# Patient Record
Sex: Female | Born: 1960 | Race: Black or African American | Hispanic: No | Smoking: Never smoker
Health system: Southern US, Community
[De-identification: ages and names within clinical notes are randomized; demographics above are authoritative.]

## PROBLEM LIST (undated history)

## (undated) DIAGNOSIS — N289 Disorder of kidney and ureter, unspecified: Secondary | ICD-10-CM

## (undated) DIAGNOSIS — E119 Type 2 diabetes mellitus without complications: Secondary | ICD-10-CM

## (undated) DIAGNOSIS — E079 Disorder of thyroid, unspecified: Secondary | ICD-10-CM

## (undated) DIAGNOSIS — I10 Essential (primary) hypertension: Secondary | ICD-10-CM

## (undated) DIAGNOSIS — Z8489 Family history of other specified conditions: Secondary | ICD-10-CM

## (undated) DIAGNOSIS — F209 Schizophrenia, unspecified: Secondary | ICD-10-CM

## (undated) HISTORY — PX: FOOT SURGERY: SHX648

---

## 2004-04-17 ENCOUNTER — Emergency Department: Payer: Self-pay | Admitting: Emergency Medicine

## 2004-06-14 ENCOUNTER — Emergency Department: Payer: Self-pay | Admitting: Emergency Medicine

## 2005-09-21 ENCOUNTER — Emergency Department: Payer: Self-pay | Admitting: Emergency Medicine

## 2008-08-03 ENCOUNTER — Emergency Department: Payer: Self-pay | Admitting: Emergency Medicine

## 2010-03-21 ENCOUNTER — Emergency Department: Payer: Self-pay | Admitting: Emergency Medicine

## 2010-04-08 ENCOUNTER — Ambulatory Visit: Payer: Self-pay | Admitting: Unknown Physician Specialty

## 2010-05-23 ENCOUNTER — Inpatient Hospital Stay: Payer: Self-pay | Admitting: Internal Medicine

## 2010-05-23 ENCOUNTER — Ambulatory Visit: Payer: Self-pay | Admitting: Family Medicine

## 2010-06-19 ENCOUNTER — Encounter: Payer: Self-pay | Admitting: Cardiothoracic Surgery

## 2010-07-18 ENCOUNTER — Emergency Department: Payer: Self-pay | Admitting: Internal Medicine

## 2010-07-19 ENCOUNTER — Encounter: Payer: Self-pay | Admitting: Cardiothoracic Surgery

## 2010-08-19 ENCOUNTER — Encounter: Payer: Self-pay | Admitting: Nurse Practitioner

## 2010-08-21 ENCOUNTER — Encounter: Payer: Self-pay | Admitting: Cardiothoracic Surgery

## 2010-08-21 ENCOUNTER — Ambulatory Visit: Payer: Self-pay | Admitting: Nurse Practitioner

## 2010-09-11 ENCOUNTER — Other Ambulatory Visit: Payer: Self-pay | Admitting: Surgery

## 2010-09-14 ENCOUNTER — Ambulatory Visit: Payer: Self-pay | Admitting: Surgery

## 2010-09-14 ENCOUNTER — Other Ambulatory Visit: Payer: Self-pay | Admitting: Surgery

## 2010-09-17 ENCOUNTER — Ambulatory Visit: Payer: Self-pay | Admitting: Surgery

## 2010-09-22 LAB — PATHOLOGY REPORT

## 2010-11-18 ENCOUNTER — Emergency Department: Payer: Self-pay | Admitting: Infectious Diseases

## 2011-09-15 ENCOUNTER — Encounter: Payer: Self-pay | Admitting: Family Medicine

## 2011-09-19 ENCOUNTER — Encounter: Payer: Self-pay | Admitting: Family Medicine

## 2011-10-19 ENCOUNTER — Encounter: Payer: Self-pay | Admitting: Family Medicine

## 2012-02-01 ENCOUNTER — Emergency Department: Payer: Self-pay | Admitting: Emergency Medicine

## 2012-02-01 LAB — URINALYSIS, COMPLETE
Ketone: NEGATIVE
Nitrite: NEGATIVE
Ph: 5 (ref 4.5–8.0)
Protein: NEGATIVE
RBC,UR: 4 /HPF (ref 0–5)
Specific Gravity: 1.013 (ref 1.003–1.030)
Squamous Epithelial: 7
WBC UR: 11 /HPF (ref 0–5)

## 2012-02-01 LAB — COMPREHENSIVE METABOLIC PANEL
Albumin: 2.4 g/dL — ABNORMAL LOW (ref 3.4–5.0)
Anion Gap: 7 (ref 7–16)
Bilirubin,Total: 0.5 mg/dL (ref 0.2–1.0)
Calcium, Total: 8 mg/dL — ABNORMAL LOW (ref 8.5–10.1)
Co2: 24 mmol/L (ref 21–32)
EGFR (African American): 55 — ABNORMAL LOW
EGFR (Non-African Amer.): 48 — ABNORMAL LOW
Glucose: 195 mg/dL — ABNORMAL HIGH (ref 65–99)
Potassium: 3.4 mmol/L — ABNORMAL LOW (ref 3.5–5.1)
SGPT (ALT): 25 U/L (ref 12–78)
Sodium: 137 mmol/L (ref 136–145)
Total Protein: 7.6 g/dL (ref 6.4–8.2)

## 2012-02-01 LAB — CBC
HCT: 32.3 % — ABNORMAL LOW (ref 35.0–47.0)
HGB: 10.5 g/dL — ABNORMAL LOW (ref 12.0–16.0)
MCHC: 32.4 g/dL (ref 32.0–36.0)
MCV: 71 fL — ABNORMAL LOW (ref 80–100)
RBC: 4.55 10*6/uL (ref 3.80–5.20)
RDW: 16.1 % — ABNORMAL HIGH (ref 11.5–14.5)
WBC: 10.8 10*3/uL (ref 3.6–11.0)

## 2012-02-01 LAB — MAGNESIUM: Magnesium: 1.8 mg/dL

## 2012-02-01 LAB — CK TOTAL AND CKMB (NOT AT ARMC): CK, Total: 1335 U/L — ABNORMAL HIGH (ref 21–215)

## 2012-02-01 LAB — TROPONIN I: Troponin-I: <0.02 ng/mL

## 2012-03-27 ENCOUNTER — Ambulatory Visit: Payer: Self-pay | Admitting: Family Medicine

## 2012-03-31 ENCOUNTER — Encounter: Payer: Self-pay | Admitting: Cardiothoracic Surgery

## 2012-04-14 ENCOUNTER — Ambulatory Visit: Payer: Self-pay | Admitting: Nurse Practitioner

## 2012-04-18 ENCOUNTER — Encounter: Payer: Self-pay | Admitting: Cardiothoracic Surgery

## 2012-05-01 ENCOUNTER — Ambulatory Visit: Payer: Self-pay | Admitting: Vascular Surgery

## 2012-05-01 LAB — BASIC METABOLIC PANEL
Anion Gap: 4 — ABNORMAL LOW (ref 7–16)
Calcium, Total: 7.4 mg/dL — ABNORMAL LOW (ref 8.5–10.1)
Co2: 27 mmol/L (ref 21–32)
Creatinine: 1.23 mg/dL (ref 0.60–1.30)
Glucose: 102 mg/dL — ABNORMAL HIGH (ref 65–99)
Osmolality: 279 (ref 275–301)
Potassium: 4.1 mmol/L (ref 3.5–5.1)

## 2012-05-02 ENCOUNTER — Ambulatory Visit: Payer: Self-pay | Admitting: Vascular Surgery

## 2012-05-02 LAB — CBC
HCT: 27.6 % — ABNORMAL LOW (ref 35.0–47.0)
MCH: 22.3 pg — ABNORMAL LOW (ref 26.0–34.0)
MCHC: 31.1 g/dL — ABNORMAL LOW (ref 32.0–36.0)
MCV: 72 fL — ABNORMAL LOW (ref 80–100)
Platelet: 374 10*3/uL (ref 150–440)
RDW: 17.7 % — ABNORMAL HIGH (ref 11.5–14.5)
WBC: 6.1 10*3/uL (ref 3.6–11.0)

## 2012-05-02 LAB — BASIC METABOLIC PANEL
BUN: 10 mg/dL (ref 7–18)
Calcium, Total: 7.5 mg/dL — ABNORMAL LOW (ref 8.5–10.1)
Chloride: 109 mmol/L — ABNORMAL HIGH (ref 98–107)
Co2: 28 mmol/L (ref 21–32)
Creatinine: 1.06 mg/dL (ref 0.60–1.30)
EGFR (African American): >60
EGFR (Non-African Amer.): >60
Glucose: 97 mg/dL (ref 65–99)
Osmolality: 278 (ref 275–301)
Sodium: 140 mmol/L (ref 136–145)

## 2012-05-04 ENCOUNTER — Ambulatory Visit: Payer: Self-pay | Admitting: Vascular Surgery

## 2012-05-08 LAB — PATHOLOGY REPORT

## 2012-05-18 ENCOUNTER — Encounter: Payer: Self-pay | Admitting: Cardiothoracic Surgery

## 2012-08-16 ENCOUNTER — Ambulatory Visit: Payer: Self-pay | Admitting: Ophthalmology

## 2012-08-25 ENCOUNTER — Emergency Department: Payer: Self-pay | Admitting: Emergency Medicine

## 2012-08-25 LAB — CBC
HCT: 32.3 % — ABNORMAL LOW (ref 35.0–47.0)
MCH: 21.9 pg — ABNORMAL LOW (ref 26.0–34.0)
MCHC: 32.4 g/dL (ref 32.0–36.0)
MCV: 68 fL — ABNORMAL LOW (ref 80–100)
RBC: 4.79 10*6/uL (ref 3.80–5.20)

## 2012-08-25 LAB — URINALYSIS, COMPLETE
Bilirubin,UR: NEGATIVE
Glucose,UR: NEGATIVE mg/dL (ref 0–75)
Hyaline Cast: 2
Ketone: NEGATIVE
Nitrite: NEGATIVE
Ph: 6 (ref 4.5–8.0)
RBC,UR: 6 /HPF (ref 0–5)
Specific Gravity: 1.014 (ref 1.003–1.030)
Squamous Epithelial: 15
WBC UR: 26 /HPF (ref 0–5)

## 2012-08-25 LAB — COMPREHENSIVE METABOLIC PANEL
Albumin: 2.9 g/dL — ABNORMAL LOW (ref 3.4–5.0)
BUN: 18 mg/dL (ref 7–18)
Calcium, Total: 8.4 mg/dL — ABNORMAL LOW (ref 8.5–10.1)
Chloride: 106 mmol/L (ref 98–107)
Co2: 26 mmol/L (ref 21–32)
Creatinine: 1.42 mg/dL — ABNORMAL HIGH (ref 0.60–1.30)
EGFR (African American): 50 — ABNORMAL LOW
EGFR (Non-African Amer.): 43 — ABNORMAL LOW
Glucose: 126 mg/dL — ABNORMAL HIGH (ref 65–99)
Osmolality: 279 (ref 275–301)
Potassium: 4 mmol/L (ref 3.5–5.1)
Total Protein: 8.7 g/dL — ABNORMAL HIGH (ref 6.4–8.2)

## 2012-11-01 ENCOUNTER — Encounter: Payer: Self-pay | Admitting: Surgery

## 2012-11-18 ENCOUNTER — Encounter: Payer: Self-pay | Admitting: Surgery

## 2012-12-18 ENCOUNTER — Encounter: Payer: Self-pay | Admitting: Surgery

## 2013-01-01 LAB — WOUND AEROBIC CULTURE

## 2013-01-17 ENCOUNTER — Inpatient Hospital Stay: Payer: Self-pay | Admitting: Internal Medicine

## 2013-01-17 LAB — BASIC METABOLIC PANEL
Anion Gap: 3 — ABNORMAL LOW (ref 7–16)
BUN: 45 mg/dL — ABNORMAL HIGH (ref 7–18)
Calcium, Total: 9.1 mg/dL (ref 8.5–10.1)
Co2: 27 mmol/L (ref 21–32)
EGFR (African American): 37 — ABNORMAL LOW
Glucose: 111 mg/dL — ABNORMAL HIGH (ref 65–99)
Osmolality: 282 (ref 275–301)
Potassium: 4.6 mmol/L (ref 3.5–5.1)

## 2013-01-17 LAB — CBC WITH DIFFERENTIAL/PLATELET
Basophil #: 0.1 10*3/uL (ref 0.0–0.1)
Basophil %: 1 %
Eosinophil #: 0.3 10*3/uL (ref 0.0–0.7)
Eosinophil %: 4 %
HCT: 30.3 % — ABNORMAL LOW (ref 35.0–47.0)
HGB: 9.7 g/dL — ABNORMAL LOW (ref 12.0–16.0)
Lymphocyte #: 1 10*3/uL (ref 1.0–3.6)
Lymphocyte %: 15.3 %
MCV: 69 fL — ABNORMAL LOW (ref 80–100)
Monocyte #: 0.6 x10 3/mm (ref 0.2–0.9)
Monocyte %: 9.5 %
Neutrophil #: 4.4 10*3/uL (ref 1.4–6.5)
Neutrophil %: 70.2 %
RBC: 4.38 10*6/uL (ref 3.80–5.20)
RDW: 16.1 % — ABNORMAL HIGH (ref 11.5–14.5)
WBC: 6.3 10*3/uL (ref 3.6–11.0)

## 2013-01-18 LAB — CBC WITH DIFFERENTIAL/PLATELET
Basophil #: 0 10*3/uL (ref 0.0–0.1)
Basophil %: 0.5 %
Eosinophil #: 0.2 10*3/uL (ref 0.0–0.7)
Eosinophil %: 4.5 %
HCT: 26.2 % — ABNORMAL LOW (ref 35.0–47.0)
HGB: 8.4 g/dL — ABNORMAL LOW (ref 12.0–16.0)
Lymphocyte #: 0.7 10*3/uL — ABNORMAL LOW (ref 1.0–3.6)
Lymphocyte %: 16.1 %
MCH: 22 pg — ABNORMAL LOW (ref 26.0–34.0)
MCHC: 31.9 g/dL — ABNORMAL LOW (ref 32.0–36.0)
MCV: 69 fL — ABNORMAL LOW (ref 80–100)
Monocyte #: 0.5 x10 3/mm (ref 0.2–0.9)
Monocyte %: 10.7 %
Neutrophil #: 3 10*3/uL (ref 1.4–6.5)
Neutrophil %: 68.2 %
Platelet: 324 10*3/uL (ref 150–440)
RBC: 3.8 10*6/uL (ref 3.80–5.20)
RDW: 16.1 % — ABNORMAL HIGH (ref 11.5–14.5)
WBC: 4.3 10*3/uL (ref 3.6–11.0)

## 2013-01-18 LAB — BASIC METABOLIC PANEL
Anion Gap: 5 — ABNORMAL LOW (ref 7–16)
BUN: 38 mg/dL — ABNORMAL HIGH (ref 7–18)
Calcium, Total: 8.2 mg/dL — ABNORMAL LOW (ref 8.5–10.1)
Chloride: 108 mmol/L — ABNORMAL HIGH (ref 98–107)
Co2: 25 mmol/L (ref 21–32)
Creatinine: 1.61 mg/dL — ABNORMAL HIGH (ref 0.60–1.30)
EGFR (African American): 43 — ABNORMAL LOW
EGFR (Non-African Amer.): 37 — ABNORMAL LOW
Glucose: 114 mg/dL — ABNORMAL HIGH (ref 65–99)
Osmolality: 286 (ref 275–301)
Potassium: 5 mmol/L (ref 3.5–5.1)
Sodium: 138 mmol/L (ref 136–145)

## 2013-01-19 LAB — BASIC METABOLIC PANEL
Anion Gap: 3 — ABNORMAL LOW (ref 7–16)
BUN: 29 mg/dL — ABNORMAL HIGH (ref 7–18)
CALCIUM: 8.2 mg/dL — AB (ref 8.5–10.1)
Chloride: 105 mmol/L (ref 98–107)
Co2: 27 mmol/L (ref 21–32)
Creatinine: 1.64 mg/dL — ABNORMAL HIGH (ref 0.60–1.30)
EGFR (African American): 42 — ABNORMAL LOW
EGFR (Non-African Amer.): 36 — ABNORMAL LOW
Glucose: 106 mg/dL — ABNORMAL HIGH (ref 65–99)
Osmolality: 276 (ref 275–301)
Potassium: 5.2 mmol/L — ABNORMAL HIGH (ref 3.5–5.1)
SODIUM: 135 mmol/L — AB (ref 136–145)

## 2013-01-20 ENCOUNTER — Encounter: Payer: Self-pay | Admitting: Surgery

## 2013-01-22 LAB — CULTURE, BLOOD (SINGLE)

## 2013-01-25 ENCOUNTER — Encounter: Payer: Self-pay | Admitting: Surgery

## 2013-02-18 ENCOUNTER — Encounter: Payer: Self-pay | Admitting: Surgery

## 2013-03-18 ENCOUNTER — Encounter: Payer: Self-pay | Admitting: Surgery

## 2013-04-18 ENCOUNTER — Encounter: Payer: Self-pay | Admitting: Surgery

## 2013-04-20 ENCOUNTER — Encounter: Payer: Self-pay | Admitting: Surgery

## 2013-09-30 ENCOUNTER — Inpatient Hospital Stay: Payer: Self-pay | Admitting: Internal Medicine

## 2013-09-30 LAB — COMPREHENSIVE METABOLIC PANEL
AST: 61 U/L — AB (ref 15–37)
Albumin: 2.6 g/dL — ABNORMAL LOW (ref 3.4–5.0)
Alkaline Phosphatase: 136 U/L — ABNORMAL HIGH
Anion Gap: 9 (ref 7–16)
BILIRUBIN TOTAL: 0.5 mg/dL (ref 0.2–1.0)
BUN: 87 mg/dL — ABNORMAL HIGH (ref 7–18)
CO2: 21 mmol/L (ref 21–32)
Calcium, Total: 8.7 mg/dL (ref 8.5–10.1)
Chloride: 102 mmol/L (ref 98–107)
Creatinine: 4.07 mg/dL — ABNORMAL HIGH (ref 0.60–1.30)
EGFR (African American): 14 — ABNORMAL LOW
EGFR (Non-African Amer.): 12 — ABNORMAL LOW
Glucose: 187 mg/dL — ABNORMAL HIGH (ref 65–99)
Osmolality: 296 (ref 275–301)
POTASSIUM: 4.4 mmol/L (ref 3.5–5.1)
SGPT (ALT): 51 U/L
SODIUM: 132 mmol/L — AB (ref 136–145)
Total Protein: 8.6 g/dL — ABNORMAL HIGH (ref 6.4–8.2)

## 2013-09-30 LAB — URINALYSIS, COMPLETE
Bilirubin,UR: NEGATIVE
Glucose,UR: NEGATIVE mg/dL (ref 0–75)
Ketone: NEGATIVE
Nitrite: NEGATIVE
PH: 5 (ref 4.5–8.0)
RBC,UR: 7 /HPF (ref 0–5)
Specific Gravity: 1.013 (ref 1.003–1.030)
Squamous Epithelial: 2
WBC UR: 60 /HPF (ref 0–5)

## 2013-09-30 LAB — CBC WITH DIFFERENTIAL/PLATELET
BASOS PCT: 0.4 %
Basophil #: 0.1 10*3/uL (ref 0.0–0.1)
EOS PCT: 0 %
Eosinophil #: 0 10*3/uL (ref 0.0–0.7)
HCT: 36.9 % (ref 35.0–47.0)
HGB: 11.6 g/dL — ABNORMAL LOW (ref 12.0–16.0)
LYMPHS ABS: 0.9 10*3/uL — AB (ref 1.0–3.6)
Lymphocyte %: 4.8 %
MCH: 22.4 pg — AB (ref 26.0–34.0)
MCHC: 31.5 g/dL — ABNORMAL LOW (ref 32.0–36.0)
MCV: 71 fL — AB (ref 80–100)
MONO ABS: 0.7 x10 3/mm (ref 0.2–0.9)
Monocyte %: 3.8 %
Neutrophil #: 17.1 10*3/uL — ABNORMAL HIGH (ref 1.4–6.5)
Neutrophil %: 91 %
Platelet: 290 10*3/uL (ref 150–440)
RBC: 5.19 10*6/uL (ref 3.80–5.20)
RDW: 16.3 % — ABNORMAL HIGH (ref 11.5–14.5)
WBC: 18.8 10*3/uL — ABNORMAL HIGH (ref 3.6–11.0)

## 2013-09-30 LAB — CK TOTAL AND CKMB (NOT AT ARMC)
CK, Total: 1313 U/L — ABNORMAL HIGH
CK-MB: 27.6 ng/mL — AB (ref 0.5–3.6)

## 2013-09-30 LAB — TROPONIN I: Troponin-I: <0.02 ng/mL

## 2013-10-01 LAB — BASIC METABOLIC PANEL
Anion Gap: 13 (ref 7–16)
BUN: 86 mg/dL — AB (ref 7–18)
CALCIUM: 8.2 mg/dL — AB (ref 8.5–10.1)
CO2: 20 mmol/L — AB (ref 21–32)
CREATININE: 3.21 mg/dL — AB (ref 0.60–1.30)
Chloride: 107 mmol/L (ref 98–107)
EGFR (African American): 18 — ABNORMAL LOW
GFR CALC NON AF AMER: 16 — AB
Glucose: 170 mg/dL — ABNORMAL HIGH (ref 65–99)
OSMOLALITY: 310 (ref 275–301)
Potassium: 4.4 mmol/L (ref 3.5–5.1)
Sodium: 140 mmol/L (ref 136–145)

## 2013-10-02 LAB — CBC WITH DIFFERENTIAL/PLATELET
EOS PCT: 1 %
HCT: 26 % — AB (ref 35.0–47.0)
HGB: 8.5 g/dL — ABNORMAL LOW (ref 12.0–16.0)
LYMPHS PCT: 17 %
MCH: 22.9 pg — ABNORMAL LOW (ref 26.0–34.0)
MCHC: 32.6 g/dL (ref 32.0–36.0)
MCV: 70 fL — AB (ref 80–100)
MONOS PCT: 1 %
Myelocyte: 1 %
Platelet: 256 10*3/uL (ref 150–440)
RBC: 3.7 10*6/uL — AB (ref 3.80–5.20)
RDW: 16.2 % — AB (ref 11.5–14.5)
Segmented Neutrophils: 80 %
WBC: 8.5 10*3/uL (ref 3.6–11.0)

## 2013-10-02 LAB — BASIC METABOLIC PANEL
Anion Gap: 9 (ref 7–16)
BUN: 64 mg/dL — ABNORMAL HIGH (ref 7–18)
CHLORIDE: 116 mmol/L — AB (ref 98–107)
CREATININE: 1.66 mg/dL — AB (ref 0.60–1.30)
Calcium, Total: 6.5 mg/dL — CL (ref 8.5–10.1)
Co2: 20 mmol/L — ABNORMAL LOW (ref 21–32)
EGFR (Non-African Amer.): 35 — ABNORMAL LOW
GFR CALC AF AMER: 41 — AB
Glucose: 162 mg/dL — ABNORMAL HIGH (ref 65–99)
Osmolality: 311 (ref 275–301)
POTASSIUM: 3.2 mmol/L — AB (ref 3.5–5.1)
Sodium: 145 mmol/L (ref 136–145)

## 2013-10-02 LAB — IRON AND TIBC
IRON SATURATION: 20 %
IRON: 30 ug/dL — AB (ref 50–170)
Iron Bind.Cap.(Total): 153 ug/dL — ABNORMAL LOW (ref 250–450)
UNBOUND IRON-BIND. CAP.: 123 ug/dL

## 2013-10-02 LAB — FERRITIN: Ferritin (ARMC): 685 ng/mL — ABNORMAL HIGH (ref 8–388)

## 2013-10-02 LAB — CK: CK, Total: 375 U/L — ABNORMAL HIGH

## 2013-10-02 LAB — URINE CULTURE

## 2013-10-03 LAB — BASIC METABOLIC PANEL
Anion Gap: 7 (ref 7–16)
BUN: 50 mg/dL — AB (ref 7–18)
CHLORIDE: 112 mmol/L — AB (ref 98–107)
CO2: 22 mmol/L (ref 21–32)
Calcium, Total: 7.5 mg/dL — ABNORMAL LOW (ref 8.5–10.1)
Creatinine: 1.37 mg/dL — ABNORMAL HIGH (ref 0.60–1.30)
EGFR (African American): 52 — ABNORMAL LOW
EGFR (Non-African Amer.): 45 — ABNORMAL LOW
GLUCOSE: 154 mg/dL — AB (ref 65–99)
OSMOLALITY: 299 (ref 275–301)
Potassium: 4.4 mmol/L (ref 3.5–5.1)
Sodium: 141 mmol/L (ref 136–145)

## 2013-10-05 LAB — CULTURE, BLOOD (SINGLE)

## 2013-10-27 ENCOUNTER — Emergency Department: Payer: Self-pay | Admitting: Emergency Medicine

## 2013-10-27 LAB — URINALYSIS, COMPLETE
Bacteria: NONE SEEN
Bilirubin,UR: NEGATIVE
Glucose,UR: NEGATIVE mg/dL (ref 0–75)
Ketone: NEGATIVE
Leukocyte Esterase: NEGATIVE
Nitrite: NEGATIVE
PH: 6 (ref 4.5–8.0)
Protein: 100
RBC,UR: 139 /HPF (ref 0–5)
Specific Gravity: 1.013 (ref 1.003–1.030)
Squamous Epithelial: 1

## 2013-10-27 LAB — COMPREHENSIVE METABOLIC PANEL
ALBUMIN: 2.8 g/dL — AB (ref 3.4–5.0)
AST: 26 U/L (ref 15–37)
Alkaline Phosphatase: 182 U/L — ABNORMAL HIGH
Anion Gap: 7 (ref 7–16)
BILIRUBIN TOTAL: 0.3 mg/dL (ref 0.2–1.0)
BUN: 21 mg/dL — AB (ref 7–18)
CHLORIDE: 108 mmol/L — AB (ref 98–107)
Calcium, Total: 7.8 mg/dL — ABNORMAL LOW (ref 8.5–10.1)
Co2: 28 mmol/L (ref 21–32)
Creatinine: 1.24 mg/dL (ref 0.60–1.30)
EGFR (African American): 59 — ABNORMAL LOW
EGFR (Non-African Amer.): 48 — ABNORMAL LOW
Glucose: 136 mg/dL — ABNORMAL HIGH (ref 65–99)
OSMOLALITY: 290 (ref 275–301)
Potassium: 5 mmol/L (ref 3.5–5.1)
SGPT (ALT): 14 U/L
SODIUM: 143 mmol/L (ref 136–145)
Total Protein: 8.1 g/dL (ref 6.4–8.2)

## 2013-10-27 LAB — CBC
HCT: 34.1 % — AB (ref 35.0–47.0)
HGB: 10.2 g/dL — AB (ref 12.0–16.0)
MCH: 22.1 pg — ABNORMAL LOW (ref 26.0–34.0)
MCHC: 29.7 g/dL — ABNORMAL LOW (ref 32.0–36.0)
MCV: 75 fL — ABNORMAL LOW (ref 80–100)
Platelet: 231 10*3/uL (ref 150–440)
RBC: 4.59 10*6/uL (ref 3.80–5.20)
RDW: 17.7 % — AB (ref 11.5–14.5)
WBC: 6.1 10*3/uL (ref 3.6–11.0)

## 2013-12-06 ENCOUNTER — Observation Stay: Payer: Self-pay | Admitting: Internal Medicine

## 2013-12-06 LAB — COMPREHENSIVE METABOLIC PANEL
ALBUMIN: 3.3 g/dL — AB (ref 3.4–5.0)
AST: 17 U/L (ref 15–37)
Alkaline Phosphatase: 143 U/L — ABNORMAL HIGH
Anion Gap: 7 (ref 7–16)
BILIRUBIN TOTAL: 0.5 mg/dL (ref 0.2–1.0)
BUN: 31 mg/dL — ABNORMAL HIGH (ref 7–18)
CHLORIDE: 105 mmol/L (ref 98–107)
CREATININE: 1.68 mg/dL — AB (ref 0.60–1.30)
Calcium, Total: 8.8 mg/dL (ref 8.5–10.1)
Co2: 27 mmol/L (ref 21–32)
EGFR (African American): 41 — ABNORMAL LOW
EGFR (Non-African Amer.): 34 — ABNORMAL LOW
Glucose: 164 mg/dL — ABNORMAL HIGH (ref 65–99)
OSMOLALITY: 288 (ref 275–301)
Potassium: 3.9 mmol/L (ref 3.5–5.1)
SGPT (ALT): 16 U/L
Sodium: 139 mmol/L (ref 136–145)
TOTAL PROTEIN: 9.4 g/dL — AB (ref 6.4–8.2)

## 2013-12-06 LAB — URINALYSIS, COMPLETE
BILIRUBIN, UR: NEGATIVE
Blood: NEGATIVE
Glucose,UR: NEGATIVE mg/dL (ref 0–75)
KETONE: NEGATIVE
LEUKOCYTE ESTERASE: NEGATIVE
Nitrite: NEGATIVE
PROTEIN: NEGATIVE
Ph: 5 (ref 4.5–8.0)
RBC,UR: NONE SEEN /HPF (ref 0–5)
SPECIFIC GRAVITY: 1.014 (ref 1.003–1.030)

## 2013-12-06 LAB — DRUG SCREEN, URINE

## 2013-12-06 LAB — PROTIME-INR
INR: 1.2
Prothrombin Time: 14.9 secs — ABNORMAL HIGH (ref 11.5–14.7)

## 2013-12-06 LAB — CBC
HCT: 39.9 % (ref 35.0–47.0)
HGB: 12.4 g/dL (ref 12.0–16.0)
MCH: 22.8 pg — ABNORMAL LOW (ref 26.0–34.0)
MCHC: 31.2 g/dL — AB (ref 32.0–36.0)
MCV: 73 fL — AB (ref 80–100)
PLATELETS: 223 10*3/uL (ref 150–440)
RBC: 5.45 10*6/uL — AB (ref 3.80–5.20)
RDW: 14.8 % — ABNORMAL HIGH (ref 11.5–14.5)
WBC: 9.4 10*3/uL (ref 3.6–11.0)

## 2013-12-06 LAB — TROPONIN I: Troponin-I: <0.02 ng/mL

## 2013-12-06 LAB — ETHANOL: Ethanol: <3 mg/dL

## 2013-12-06 LAB — LIPASE, BLOOD: Lipase: 66 U/L — ABNORMAL LOW (ref 73–393)

## 2013-12-07 LAB — BASIC METABOLIC PANEL
Anion Gap: 8 (ref 7–16)
BUN: 33 mg/dL — AB (ref 7–18)
CALCIUM: 7.8 mg/dL — AB (ref 8.5–10.1)
Chloride: 108 mmol/L — ABNORMAL HIGH (ref 98–107)
Co2: 28 mmol/L (ref 21–32)
Creatinine: 1.47 mg/dL — ABNORMAL HIGH (ref 0.60–1.30)
EGFR (African American): 48 — ABNORMAL LOW
EGFR (Non-African Amer.): 40 — ABNORMAL LOW
GLUCOSE: 97 mg/dL (ref 65–99)
Osmolality: 294 (ref 275–301)
POTASSIUM: 3.6 mmol/L (ref 3.5–5.1)
SODIUM: 144 mmol/L (ref 136–145)

## 2013-12-07 LAB — CBC WITH DIFFERENTIAL/PLATELET
Basophil #: 0 10*3/uL (ref 0.0–0.1)
Basophil %: 0.2 %
EOS PCT: 1.3 %
Eosinophil #: 0.1 10*3/uL (ref 0.0–0.7)
HCT: 32.2 % — ABNORMAL LOW (ref 35.0–47.0)
HGB: 10 g/dL — ABNORMAL LOW (ref 12.0–16.0)
LYMPHS PCT: 13 %
Lymphocyte #: 1.1 10*3/uL (ref 1.0–3.6)
MCH: 22.9 pg — AB (ref 26.0–34.0)
MCHC: 31.1 g/dL — ABNORMAL LOW (ref 32.0–36.0)
MCV: 74 fL — AB (ref 80–100)
MONO ABS: 0.8 x10 3/mm (ref 0.2–0.9)
MONOS PCT: 9 %
NEUTROS ABS: 6.6 10*3/uL — AB (ref 1.4–6.5)
NEUTROS PCT: 76.5 %
Platelet: 189 10*3/uL (ref 150–440)
RBC: 4.38 10*6/uL (ref 3.80–5.20)
RDW: 14.6 % — ABNORMAL HIGH (ref 11.5–14.5)
WBC: 8.7 10*3/uL (ref 3.6–11.0)

## 2013-12-07 LAB — TSH: Thyroid Stimulating Horm: 0.02 u[IU]/mL — ABNORMAL LOW

## 2013-12-18 ENCOUNTER — Inpatient Hospital Stay (HOSPITAL_COMMUNITY)
Admission: EM | Admit: 2013-12-18 | Discharge: 2013-12-22 | DRG: 093 | Disposition: A | Discharge status: Home / Self Care | Payer: Medicare Other | Attending: Internal Medicine | Admitting: Internal Medicine

## 2013-12-18 ENCOUNTER — Emergency Department (HOSPITAL_COMMUNITY): Payer: Medicare Other

## 2013-12-18 ENCOUNTER — Encounter (HOSPITAL_COMMUNITY): Payer: Self-pay | Admitting: Emergency Medicine

## 2013-12-18 DIAGNOSIS — G9340 Encephalopathy, unspecified: Secondary | ICD-10-CM | POA: Diagnosis present

## 2013-12-18 DIAGNOSIS — F329 Major depressive disorder, single episode, unspecified: Secondary | ICD-10-CM | POA: Diagnosis present

## 2013-12-18 DIAGNOSIS — R4182 Altered mental status, unspecified: Secondary | ICD-10-CM

## 2013-12-18 DIAGNOSIS — G92 Toxic encephalopathy: Principal | ICD-10-CM | POA: Diagnosis present

## 2013-12-18 DIAGNOSIS — E039 Hypothyroidism, unspecified: Secondary | ICD-10-CM | POA: Diagnosis present

## 2013-12-18 DIAGNOSIS — I10 Essential (primary) hypertension: Secondary | ICD-10-CM | POA: Diagnosis present

## 2013-12-18 DIAGNOSIS — J019 Acute sinusitis, unspecified: Secondary | ICD-10-CM | POA: Diagnosis present

## 2013-12-18 DIAGNOSIS — I272 Other secondary pulmonary hypertension: Secondary | ICD-10-CM | POA: Diagnosis present

## 2013-12-18 DIAGNOSIS — T43595A Adverse effect of other antipsychotics and neuroleptics, initial encounter: Secondary | ICD-10-CM | POA: Diagnosis present

## 2013-12-18 DIAGNOSIS — E119 Type 2 diabetes mellitus without complications: Secondary | ICD-10-CM

## 2013-12-18 DIAGNOSIS — H9190 Unspecified hearing loss, unspecified ear: Secondary | ICD-10-CM | POA: Diagnosis present

## 2013-12-18 DIAGNOSIS — F32A Depression, unspecified: Secondary | ICD-10-CM | POA: Diagnosis present

## 2013-12-18 DIAGNOSIS — R509 Fever, unspecified: Secondary | ICD-10-CM | POA: Diagnosis present

## 2013-12-18 DIAGNOSIS — D72829 Elevated white blood cell count, unspecified: Secondary | ICD-10-CM | POA: Diagnosis present

## 2013-12-18 HISTORY — DX: Disorder of kidney and ureter, unspecified: N28.9

## 2013-12-18 HISTORY — DX: Essential (primary) hypertension: I10

## 2013-12-18 HISTORY — DX: Type 2 diabetes mellitus without complications: E11.9

## 2013-12-18 LAB — COMPREHENSIVE METABOLIC PANEL
ALT: 10 U/L (ref 0–35)
AST: 16 U/L (ref 0–37)
Albumin: 3 g/dL — ABNORMAL LOW (ref 3.5–5.2)
Alkaline Phosphatase: 125 U/L — ABNORMAL HIGH (ref 39–117)
Anion gap: 14 (ref 5–15)
BILIRUBIN TOTAL: 0.3 mg/dL (ref 0.3–1.2)
BUN: 20 mg/dL (ref 6–23)
CHLORIDE: 101 meq/L (ref 96–112)
CO2: 28 mEq/L (ref 19–32)
CREATININE: 1.25 mg/dL — AB (ref 0.50–1.10)
Calcium: 9 mg/dL (ref 8.4–10.5)
GFR calc Af Amer: 57 mL/min — ABNORMAL LOW (ref >90)
GFR, EST NON AFRICAN AMERICAN: 49 mL/min — AB (ref >90)
GLUCOSE: 172 mg/dL — AB (ref 70–99)
Potassium: 4.4 mEq/L (ref 3.7–5.3)
Sodium: 143 mEq/L (ref 137–147)
Total Protein: 8.6 g/dL — ABNORMAL HIGH (ref 6.0–8.3)

## 2013-12-18 LAB — URINALYSIS, ROUTINE W REFLEX MICROSCOPIC
Bilirubin Urine: NEGATIVE
GLUCOSE, UA: NEGATIVE mg/dL
Hgb urine dipstick: NEGATIVE
Ketones, ur: NEGATIVE mg/dL
LEUKOCYTES UA: NEGATIVE
Nitrite: NEGATIVE
PROTEIN: NEGATIVE mg/dL
Specific Gravity, Urine: 1.011 (ref 1.005–1.030)
Urobilinogen, UA: 1 mg/dL (ref 0.0–1.0)
pH: 5 (ref 5.0–8.0)

## 2013-12-18 LAB — CBG MONITORING, ED: Glucose-Capillary: 157 mg/dL — ABNORMAL HIGH (ref 70–99)

## 2013-12-18 LAB — CBC
HEMATOCRIT: 35.1 % — AB (ref 36.0–46.0)
Hemoglobin: 11.1 g/dL — ABNORMAL LOW (ref 12.0–15.0)
MCH: 22.5 pg — ABNORMAL LOW (ref 26.0–34.0)
MCHC: 31.6 g/dL (ref 30.0–36.0)
MCV: 71.2 fL — AB (ref 78.0–100.0)
PLATELETS: 413 10*3/uL — AB (ref 150–400)
RBC: 4.93 MIL/uL (ref 3.87–5.11)
RDW: 14.4 % (ref 11.5–15.5)
WBC: 18 10*3/uL — ABNORMAL HIGH (ref 4.0–10.5)

## 2013-12-18 LAB — I-STAT CG4 LACTIC ACID, ED: Lactic Acid, Venous: 1.29 mmol/L (ref 0.5–2.2)

## 2013-12-18 MED ORDER — SODIUM CHLORIDE 0.9 % IV BOLUS (SEPSIS)
500.0000 mL | Freq: Once | INTRAVENOUS | Status: AC
  Administered 2013-12-18: 500 mL via INTRAVENOUS

## 2013-12-18 NOTE — ED Notes (Signed)
Pt continues to be monitored by blood pressure, pulse ox, and 12 lead. pts EKG given to and signed by Dr. Jeraldine LootsLockwood

## 2013-12-18 NOTE — ED Notes (Signed)
Per ems-- daughter reports she came home at 130 to find pt altered. Pt wound not answer question appropriately for ems. Stroke scale negative. Pt tx for UTI last week. Vs stable.

## 2013-12-18 NOTE — ED Notes (Signed)
pts CBG 157 reported to [place], NP

## 2013-12-18 NOTE — ED Provider Notes (Signed)
CSN: [id number]     Arrival date & time 12/18/13  2004 History   First MD Initiated Contact with Patient 12/18/13 2032     Chief Complaint  Patient presents with  . Altered Mental Status     (Consider location/radiation/quality/duration/timing/severity/associated sxs/prior Treatment) HPI Comments: Per EMS report was found by daughter to be less responsive than normal on her arrival home this evening. Patient reports nausea, no vomiting, loose stools  Patient is a 53 y.o. female presenting with altered mental status. The history is provided by the patient and the EMS personnel.  Altered Mental Status Presenting symptoms: behavior changes, lethargy and partial responsiveness   Severity:  Moderate Most recent episode:  Today Timing:  Unable to specify Progression:  Unable to specify Chronicity:  New Context: recent infection   Associated symptoms: nausea and weakness   Associated symptoms: no abdominal pain, no fever and no vomiting     Past Medical History  Diagnosis Date  . Renal disorder   . Hypertension   . Diabetes mellitus without complication    Past Surgical History  Procedure Laterality Date  . Foot surgery     No family history on file. History  Substance Use Topics  . Smoking status: Never Smoker   . Smokeless tobacco: Not on file  . Alcohol Use: Yes     Comment: occasionally   OB History    No data available     Review of Systems  Constitutional: Positive for activity change. Negative for fever.  Respiratory: Negative for shortness of breath.   Cardiovascular: Negative for chest pain.  Gastrointestinal: Positive for nausea. Negative for vomiting and abdominal pain.  Neurological: Positive for weakness.  All other systems reviewed and are negative.     Allergies  Review of patient's allergies indicates no known allergies.  Home Medications   Prior to Admission medications   Medication Sig Start Date End Date Taking? Authorizing Provider   atenolol (TENORMIN) 50 MG tablet Take 50 mg by mouth daily.   Yes Historical Provider, MD  citalopram (CELEXA) 20 MG tablet Take 20 mg by mouth daily.   Yes Historical Provider, MD  furosemide (LASIX) 40 MG tablet Take 40 mg by mouth daily.   Yes Historical Provider, MD  glimepiride (AMARYL) 4 MG tablet Take 4 mg by mouth daily with breakfast.   Yes Historical Provider, MD  levothyroxine (SYNTHROID, LEVOTHROID) 125 MCG tablet Take 125 mcg by mouth daily before breakfast.   Yes Historical Provider, MD  mirtazapine (REMERON) 15 MG tablet Take 15 mg by mouth at bedtime.   Yes Historical Provider, MD  Multiple Vitamins-Minerals (MULTIVITAMINS THER. W/MINERALS) TABS tablet Take 1 tablet by mouth daily.   Yes Historical Provider, MD  potassium chloride SA (K-DUR,KLOR-CON) 20 MEQ tablet Take 20 mEq by mouth daily.   Yes Historical Provider, MD  QUEtiapine (SEROQUEL) 50 MG tablet Take 50 mg by mouth at bedtime.   Yes Historical Provider, MD   BP 115/51 mmHg  Pulse 62  Temp(Src) 100.3 F (37.9 C) (Rectal)  Resp 22  Ht 5\' 9"  (1.753 m)  Wt 180 lb (81.647 kg)  BMI 26.57 kg/m2  SpO2 99% Physical Exam  Constitutional: She appears well-developed and well-nourished. She appears lethargic. No distress.  HENT:  Head: Normocephalic.  Eyes: Pupils are equal, round, and reactive to light.  Neck: Normal range of motion.  Cardiovascular: Normal rate and regular rhythm.   Pulmonary/Chest: Effort normal and breath sounds normal.  Abdominal: Soft. Bowel sounds are normal.  She exhibits no distension.  Musculoskeletal: She exhibits edema. She exhibits no tenderness.  History of lymphedema   Neurological: She appears lethargic.  Skin: Skin is warm and dry. She is not diaphoretic. No erythema.  Nursing note and vitals reviewed.   ED Course  Procedures (including critical care time) Labs Review Labs Reviewed  CBC - Abnormal; Notable for the following:    WBC 18.0 (*)    Hemoglobin 11.1 (*)    HCT 35.1  (*)    MCV 71.2 (*)    MCH 22.5 (*)    Platelets 413 (*)    All other components within normal limits  COMPREHENSIVE METABOLIC PANEL - Abnormal; Notable for the following:    Glucose, Bld 172 (*)    Creatinine, Ser 1.25 (*)    Total Protein 8.6 (*)    Albumin 3.0 (*)    Alkaline Phosphatase 125 (*)    GFR calc non Af Amer 49 (*)    GFR calc Af Amer 57 (*)    All other components within normal limits  CBG MONITORING, ED - Abnormal; Notable for the following:    Glucose-Capillary 157 (*)    All other components within normal limits  URINALYSIS, ROUTINE W REFLEX MICROSCOPIC  I-STAT CG4 LACTIC ACID, ED  CBG MONITORING, ED    Imaging Review Dg Chest 2 View  12/19/2013   CLINICAL DATA:  Acute onset of altered mental status and generalized weakness. Initial encounter.  EXAM: CHEST  2 VIEW  COMPARISON:  Chest radiograph performed 12/06/2013  FINDINGS: The lungs are hypoexpanded. Mild vascular crowding is noted. Mild left basilar atelectasis is seen. There is no evidence of pleural effusion or pneumothorax.  The heart is borderline enlarged. No acute osseous abnormalities are seen.  IMPRESSION: Lungs hypoexpanded, with mild left basilar atelectasis. Borderline cardiomegaly.   Electronically Signed   By: Roanna Raider M.D.   On: 12/19/2013 00:09   Ct Head Wo Contrast  12/18/2013   CLINICAL DATA:  Initial evaluation for acute altered mental status.  EXAM: CT HEAD WITHOUT CONTRAST  TECHNIQUE: Contiguous axial images were obtained from the base of the skull through the vertex without intravenous contrast.  COMPARISON:  Prior study from 12/06/2013  FINDINGS: No acute intracranial hemorrhage or large vessel territory infarct identified. No mass lesion or midline shift. Ventricles are stable in size without evidence of hydrocephalus. No extra-axial fluid collection. Prominent dural and parafalcine calcifications again noted.  Calvarium is markedly thickened but intact. Scattered calcifications within the  scalp are stable from prior. No acute scalp soft tissue abnormality. Orbits within normal limits.  Mild mucoperiosteal thickening present within the maxillary sinuses bilaterally, left greater than right. There are associated air-fluid levels, suggesting acute sinus disease. Scattered mucosal thickening present within the ethmoidal air cells bilaterally. Small air-fluid levels noted within the sphenoid sinuses as well. Mastoid air cells are well pneumatized and free of fluid.  IMPRESSION: 1. No acute intracranial process. 2. Scattered opacity with air-fluid levels within the maxillary and sphenoid sinuses bilaterally, suggesting acute sinus disease. This is slightly worsened relative to recent CT from 12/06/2013.   Electronically Signed   By: Rise Mu M.D.   On: 12/18/2013 23:35     EKG Interpretation   Date/Time:  Tuesday December 18 2013 20:23:46 EST Ventricular Rate:  70 PR Interval:  140 QRS Duration: 81 QT Interval:  417 QTC Calculation: 450 R Axis:   -10 Text Interpretation:  Sinus rhythm Right atrial enlargement Abnormal  R-wave progression, early  transition LVH by voltage Borderline T  abnormalities, anterior leads Sinus rhythm T wave abnormality Artifact  Abnormal ekg Confirmed by Gerhard MunchLOCKWOOD, ROBERT  MD [phone]([id number]) on 12/19/2013  12:30:46 AM     Will admit patient for AMS  Have no records for comparison, no family/friends to verify baseline  Also elevated WBC without a source  MDM   Final diagnoses:  Altered mental state         Arman FilterGail K Donnald Tabar, NP 12/19/13 [phone]  Gerhard Munchobert Lockwood, MD 12/19/13 [phone]

## 2013-12-18 NOTE — ED Notes (Signed)
Performed in and out cath to collect urine specimen  With assistance from [facility], EMT

## 2013-12-19 DIAGNOSIS — D72829 Elevated white blood cell count, unspecified: Secondary | ICD-10-CM | POA: Diagnosis present

## 2013-12-19 DIAGNOSIS — F32A Depression, unspecified: Secondary | ICD-10-CM | POA: Diagnosis present

## 2013-12-19 DIAGNOSIS — F329 Major depressive disorder, single episode, unspecified: Secondary | ICD-10-CM

## 2013-12-19 DIAGNOSIS — E039 Hypothyroidism, unspecified: Secondary | ICD-10-CM | POA: Diagnosis present

## 2013-12-19 DIAGNOSIS — E119 Type 2 diabetes mellitus without complications: Secondary | ICD-10-CM | POA: Diagnosis present

## 2013-12-19 DIAGNOSIS — I10 Essential (primary) hypertension: Secondary | ICD-10-CM | POA: Diagnosis present

## 2013-12-19 DIAGNOSIS — R509 Fever, unspecified: Secondary | ICD-10-CM | POA: Diagnosis present

## 2013-12-19 DIAGNOSIS — I272 Other secondary pulmonary hypertension: Secondary | ICD-10-CM | POA: Diagnosis present

## 2013-12-19 DIAGNOSIS — G9340 Encephalopathy, unspecified: Secondary | ICD-10-CM

## 2013-12-19 DIAGNOSIS — H9190 Unspecified hearing loss, unspecified ear: Secondary | ICD-10-CM | POA: Diagnosis present

## 2013-12-19 DIAGNOSIS — E038 Other specified hypothyroidism: Secondary | ICD-10-CM

## 2013-12-19 DIAGNOSIS — J019 Acute sinusitis, unspecified: Secondary | ICD-10-CM | POA: Diagnosis present

## 2013-12-19 DIAGNOSIS — R4182 Altered mental status, unspecified: Secondary | ICD-10-CM | POA: Insufficient documentation

## 2013-12-19 DIAGNOSIS — G92 Toxic encephalopathy: Secondary | ICD-10-CM | POA: Diagnosis present

## 2013-12-19 DIAGNOSIS — T43595A Adverse effect of other antipsychotics and neuroleptics, initial encounter: Secondary | ICD-10-CM | POA: Diagnosis present

## 2013-12-19 LAB — T4, FREE: Free T4: 1.44 ng/dL (ref 0.80–1.80)

## 2013-12-19 LAB — COMPREHENSIVE METABOLIC PANEL
ALT: 8 U/L (ref 0–35)
ANION GAP: 14 (ref 5–15)
AST: 15 U/L (ref 0–37)
Albumin: 2.8 g/dL — ABNORMAL LOW (ref 3.5–5.2)
Alkaline Phosphatase: 112 U/L (ref 39–117)
BUN: 21 mg/dL (ref 6–23)
CALCIUM: 8.6 mg/dL (ref 8.4–10.5)
CO2: 27 mEq/L (ref 19–32)
CREATININE: 1.28 mg/dL — AB (ref 0.50–1.10)
Chloride: 103 mEq/L (ref 96–112)
GFR, EST AFRICAN AMERICAN: 55 mL/min — AB (ref >90)
GFR, EST NON AFRICAN AMERICAN: 48 mL/min — AB (ref >90)
GLUCOSE: 133 mg/dL — AB (ref 70–99)
Potassium: 3.9 mEq/L (ref 3.7–5.3)
SODIUM: 144 meq/L (ref 137–147)
Total Bilirubin: 0.4 mg/dL (ref 0.3–1.2)
Total Protein: 8 g/dL (ref 6.0–8.3)

## 2013-12-19 LAB — TROPONIN I

## 2013-12-19 LAB — CBC
HCT: 31.7 % — ABNORMAL LOW (ref 36.0–46.0)
Hemoglobin: 9.8 g/dL — ABNORMAL LOW (ref 12.0–15.0)
MCH: 21.9 pg — ABNORMAL LOW (ref 26.0–34.0)
MCHC: 30.9 g/dL (ref 30.0–36.0)
MCV: 70.8 fL — ABNORMAL LOW (ref 78.0–100.0)
PLATELETS: 324 10*3/uL (ref 150–400)
RBC: 4.48 MIL/uL (ref 3.87–5.11)
RDW: 14.3 % (ref 11.5–15.5)
WBC: 10 10*3/uL (ref 4.0–10.5)

## 2013-12-19 LAB — GLUCOSE, CAPILLARY
Glucose-Capillary: 134 mg/dL — ABNORMAL HIGH (ref 70–99)
Glucose-Capillary: 118 mg/dL — ABNORMAL HIGH (ref 70–99)
Glucose-Capillary: 128 mg/dL — ABNORMAL HIGH (ref 70–99)
Glucose-Capillary: 120 mg/dL — ABNORMAL HIGH (ref 70–99)

## 2013-12-19 LAB — TSH: TSH: 0.023 u[IU]/mL — ABNORMAL LOW (ref 0.350–4.500)

## 2013-12-19 LAB — HEMOGLOBIN A1C
HEMOGLOBIN A1C: 6.3 % — AB (ref <5.7)
Mean Plasma Glucose: 134 mg/dL — ABNORMAL HIGH (ref <117)

## 2013-12-19 LAB — PROTIME-INR
INR: 1.35 (ref 0.00–1.49)
PROTHROMBIN TIME: 16.8 s — AB (ref 11.6–15.2)

## 2013-12-19 MED ORDER — SODIUM CHLORIDE 0.9 % IV BOLUS (SEPSIS): 500.0000 mL | Freq: Once | INTRAVENOUS | Status: DC

## 2013-12-19 MED ORDER — LEVOTHYROXINE SODIUM 25 MCG PO TABS
125.0000 ug | ORAL_TABLET | Freq: Every day | ORAL | Status: DC
  Administered 2013-12-20 – 2013-12-22 (×3): 125 ug via ORAL
  Filled 2013-12-19 (×8): qty 1

## 2013-12-19 MED ORDER — ASPIRIN 325 MG PO TABS
325.0000 mg | ORAL_TABLET | Freq: Every day | ORAL | Status: DC
  Administered 2013-12-19 – 2013-12-22 (×4): 325 mg via ORAL
  Filled 2013-12-19 (×5): qty 1

## 2013-12-19 MED ORDER — VANCOMYCIN HCL IN DEXTROSE 1-5 GM/200ML-% IV SOLN
1000.0000 mg | Freq: Two times a day (BID) | INTRAVENOUS | Status: DC
  Administered 2013-12-19 – 2013-12-21 (×5): 1000 mg via INTRAVENOUS
  Filled 2013-12-19 (×5): qty 200

## 2013-12-19 MED ORDER — SODIUM CHLORIDE 0.9 % IV SOLN
INTRAVENOUS | Status: AC
  Administered 2013-12-19: 03:00:00 via INTRAVENOUS

## 2013-12-19 MED ORDER — MIRTAZAPINE 15 MG PO TABS
15.0000 mg | ORAL_TABLET | Freq: Every day | ORAL | Status: DC
  Administered 2013-12-19 – 2013-12-21 (×3): 15 mg via ORAL
  Filled 2013-12-19 (×4): qty 1

## 2013-12-19 MED ORDER — QUETIAPINE FUMARATE 50 MG PO TABS: 50.0000 mg | ORAL_TABLET | Freq: Every day | ORAL | Status: DC

## 2013-12-19 MED ORDER — CETYLPYRIDINIUM CHLORIDE 0.05 % MT LIQD
7.0000 mL | Freq: Two times a day (BID) | OROMUCOSAL | Status: DC
  Administered 2013-12-19 – 2013-12-22 (×6): 7 mL via OROMUCOSAL

## 2013-12-19 MED ORDER — ADULT MULTIVITAMIN W/MINERALS CH
1.0000 | ORAL_TABLET | Freq: Every day | ORAL | Status: DC
  Administered 2013-12-19 – 2013-12-22 (×4): 1 via ORAL
  Filled 2013-12-19 (×8): qty 1

## 2013-12-19 MED ORDER — VANCOMYCIN HCL 10 G IV SOLR
1500.0000 mg | Freq: Once | INTRAVENOUS | Status: AC
  Administered 2013-12-19: 1500 mg via INTRAVENOUS
  Filled 2013-12-19: qty 1500

## 2013-12-19 MED ORDER — ACETAMINOPHEN 325 MG PO TABS: 650.0000 mg | ORAL_TABLET | Freq: Four times a day (QID) | ORAL | Status: DC | PRN

## 2013-12-19 MED ORDER — PIPERACILLIN-TAZOBACTAM 3.375 G IVPB
3.3750 g | Freq: Three times a day (TID) | INTRAVENOUS | Status: DC
  Administered 2013-12-19 – 2013-12-21 (×6): 3.375 g via INTRAVENOUS
  Filled 2013-12-19 (×8): qty 50

## 2013-12-19 MED ORDER — ATENOLOL 25 MG PO TABS
50.0000 mg | ORAL_TABLET | Freq: Every day | ORAL | Status: DC
  Administered 2013-12-19 – 2013-12-22 (×3): 50 mg via ORAL
  Filled 2013-12-19 (×3): qty 2

## 2013-12-19 MED ORDER — CITALOPRAM HYDROBROMIDE 10 MG PO TABS
20.0000 mg | ORAL_TABLET | Freq: Every day | ORAL | Status: DC
  Administered 2013-12-19 – 2013-12-22 (×4): 20 mg via ORAL
  Filled 2013-12-19 (×4): qty 2

## 2013-12-19 MED ORDER — ACETAMINOPHEN 650 MG RE SUPP: 650.0000 mg | Freq: Four times a day (QID) | RECTAL | Status: DC | PRN

## 2013-12-19 MED ORDER — SODIUM CHLORIDE 0.9 % IJ SOLN
3.0000 mL | Freq: Two times a day (BID) | INTRAMUSCULAR | Status: DC
  Administered 2013-12-19 – 2013-12-21 (×5): 3 mL via INTRAVENOUS

## 2013-12-19 MED ORDER — HEPARIN SODIUM (PORCINE) 5000 UNIT/ML IJ SOLN
5000.0000 [IU] | Freq: Three times a day (TID) | INTRAMUSCULAR | Status: DC
  Administered 2013-12-19 – 2013-12-22 (×8): 5000 [IU] via SUBCUTANEOUS
  Filled 2013-12-19 (×10): qty 1

## 2013-12-19 MED ORDER — INSULIN ASPART 100 UNIT/ML ~~LOC~~ SOLN
0.0000 [IU] | Freq: Three times a day (TID) | SUBCUTANEOUS | Status: DC
  Administered 2013-12-20: 1 [IU] via SUBCUTANEOUS
  Administered 2013-12-20 – 2013-12-21 (×2): 2 [IU] via SUBCUTANEOUS
  Administered 2013-12-21: 1 [IU] via SUBCUTANEOUS

## 2013-12-19 NOTE — Progress Notes (Signed)
Patient arrived from the ED per stretcher, drowsy and confused. Cardiac monitoring initiated and initial assessment performed. Pt unable to answer assessment questions. Will monitor closely overnight.

## 2013-12-19 NOTE — Evaluation (Signed)
Clinical/Bedside Swallow Evaluation Patient Details  Name: Kirsten Young MRN: [id number] Date of Birth: 1960/04/26  Today's Date: 12/19/2013 Time: [id number] SLP Time Calculation (min) (ACUTE ONLY): 13 min  Past Medical History:  Past Medical History  Diagnosis Date  . Renal disorder   . Hypertension   . Diabetes mellitus without complication    Past Surgical History:  Past Surgical History  Procedure Laterality Date  . Foot surgery     HPI:  Kirsten Young is a 53 y.o. female with a past medical history of hypertension, diabetes mellitus, depression, hypothyroidism, who presents with altered mental status due to infection of unknown origin.    Assessment / Plan / Recommendation Clinical Impression  Despite very dry, cracked oral mucosa, pt demosntrated adequate swallow function following assist with oral hygiene. At baseline she avoids meats due to missing dentition. Recommend pt initiate a dys 3 (mechanical soft) diet with thin liquids. Also of note, pt is extremely hard of hearing and also visually impaired.     Aspiration Risk  Mild    Diet Recommendation Dysphagia 3 (Mechanical Soft);Thin liquid   Liquid Administration via: Cup;Straw Medication Administration: Whole meds with liquid Supervision: Patient able to self feed Postural Changes and/or Swallow Maneuvers: Seated upright 90 degrees    Other  Recommendations Oral Care Recommendations: Oral care BID   Follow Up Recommendations  None    Frequency and Duration        Pertinent Vitals/Pain NA    SLP Swallow Goals     Swallow Study Prior Functional Status       General HPI: Kirsten Young is a 53 y.o. female with a past medical history of hypertension, diabetes mellitus, depression, hypothyroidism, who presents with altered mental status due to infection of unknown origin.  Type of Study: Bedside swallow evaluation Previous Swallow Assessment: none Diet Prior to this Study: NPO Temperature Spikes Noted:  No Respiratory Status: Room air History of Recent Intubation: No Behavior/Cognition: Alert;Cooperative;Pleasant mood;Hard of hearing Oral Cavity - Dentition: Edentulous Self-Feeding Abilities: Able to feed self Patient Positioning: Upright in bed Baseline Vocal Quality: Clear Volitional Cough: Strong Volitional Swallow: Able to elicit    Oral/Motor/Sensory Function Overall Oral Motor/Sensory Function: Appears within functional limits for tasks assessed   Ice Chips     Thin Liquid Thin Liquid: Within functional limits Presentation: Cup;Straw;Self Fed    Nectar Thick Nectar Thick Liquid: Not tested   Honey Thick Honey Thick Liquid: Not tested   Puree Puree: Within functional limits   Solid   GO    Solid: Impaired Presentation: Self Fed Oral Phase Functional Implications:  (prolonged mastication)      Harlon DittyBonnie Ardit Danh, MA CCC-SLP [phone]  Kirsten Young, Kirsten NearingBonnie Young 12/19/2013,9:57 AM

## 2013-12-19 NOTE — Care Management Note (Signed)
Patient is active with [facility] for RN & PT.  Resumption of care orders requested upon discharge. °

## 2013-12-19 NOTE — Plan of Care (Signed)
Problem: Phase I Progression Outcomes Goal: Pain controlled with appropriate interventions Outcome: Progressing Goal: Voiding-avoid urinary catheter unless indicated Outcome: Progressing Goal: Hemodynamically stable Outcome: Progressing     

## 2013-12-19 NOTE — Progress Notes (Signed)
   12/19/13 0900  SLP G-Codes **NOT FOR INPATIENT CLASS**  Functional Assessment Tool Used clinical judgement  Functional Limitations Swallowing  Swallow Current Status (Z6109(G8996) [facility]  [facility]allow [facility]8997) [facility]  Swallow Discharge Status (U9811(G8998) [facility]  SLP Evaluations  $ SLP Speech Visit 1 Procedure  SLP Evaluations  $BSS Swallow 1 Procedure

## 2013-12-19 NOTE — Progress Notes (Signed)
ANTIBIOTIC CONSULT NOTE - INITIAL  Pharmacy Consult for Vancomycin and Zosyn  Indication: rule out sepsis  No Known Allergies  Patient Measurements: Height: 5\' 9"  (175.3 cm) Weight: 180 lb (81.647 kg) IBW/kg (Calculated) : 66.2  Vital Signs: Temp: 99.6 F (37.6 C) (12/02 0252) Temp Source: Oral (12/02 0252) BP: 114/50 mmHg (12/02 0252) Pulse Rate: 62 (12/02 0252) Intake/Output from previous day: 12/01 0701 - 12/02 0700 In: -  Out: 225 [Urine:225] Intake/Output from this shift: Total I/O In: -  Out: 225 [Urine:225]  Labs:  Recent Labs  12/18/13 2052  WBC 18.0*  HGB 11.1*  PLT 413*  CREATININE 1.25*   Estimated Creatinine Clearance: 60.9 mL/min (by C-G formula based on Cr of 1.25). No results for input(s): VANCOTROUGH, VANCOPEAK, VANCORANDOM, GENTTROUGH, GENTPEAK, GENTRANDOM, TOBRATROUGH, TOBRAPEAK, TOBRARND, AMIKACINPEAK, AMIKACINTROU, AMIKACIN in the last 72 hours.   Microbiology: No results found for this or any previous visit (from the past 720 hour(s)).  Medical History: Past Medical History  Diagnosis Date  . Renal disorder   . Hypertension   . Diabetes mellitus without complication     Medications:  Prescriptions prior to admission  Medication Sig Dispense Refill Last Dose  . atenolol (TENORMIN) 50 MG tablet Take 50 mg by mouth daily.   unknown  . citalopram (CELEXA) 20 MG tablet Take 20 mg by mouth daily.   unknown  . furosemide (LASIX) 40 MG tablet Take 40 mg by mouth daily.   unknown  . glimepiride (AMARYL) 4 MG tablet Take 4 mg by mouth daily with breakfast.   unknown  . levothyroxine (SYNTHROID, LEVOTHROID) 125 MCG tablet Take 125 mcg by mouth daily before breakfast.   unknown  . mirtazapine (REMERON) 15 MG tablet Take 15 mg by mouth at bedtime.   unknown  . Multiple Vitamins-Minerals (MULTIVITAMINS THER. W/MINERALS) TABS tablet Take 1 tablet by mouth daily.   unknown  . potassium chloride SA (K-DUR,KLOR-CON) 20 MEQ tablet Take 20 mEq by mouth  daily.   unknown  . QUEtiapine (SEROQUEL) 50 MG tablet Take 50 mg by mouth at bedtime.   unknown   Assessment: 53 yo female with AMS/fevers, possible sepsis, for empiric antibiotics  Goal of Therapy:  Vancomycin trough level 15-20 mcg/ml  Plan:  Vancomycin 1500 mg IV now, then 1 g IV q12h Zosyn 3.375 g IV q8h   Eddie Candlebbott, Miro Balderson Vernon 12/19/2013,3:04 AM

## 2013-12-19 NOTE — Progress Notes (Addendum)
[facility] PROGRESS NOTE  Kirsten ChessmanMary E Young ZOX:[id number] DOB: July 04, 1960 DOA: 12/18/2013  PCP: Ardelia MemsUnable to determine at this time  Brief HPI: This is a 53 year old African-American female with a past medical history of hypertension, diabetes, hypothyroidism, who presented with altered mental status. She was noted to have a temperature of 100.3. Apparently was complaining of flank pain. CT head suggested acute sinusitis. No obvious focal neurological deficits were noted.  Past medical history:  Past Medical History  Diagnosis Date  . Renal disorder   . Hypertension   . Diabetes mellitus without complication     Consultants: None  Procedures: None  Antibiotics: Vancomycin and Zosyn 12/2  Subjective: Patient is confused. Unable to provide any history.  Objective: Vital Signs  Filed Vitals:   12/19/13 0252 12/19/13 0500 12/19/13 0514 12/19/13 0919  BP: 114/50  116/48 125/78  Pulse: 62  61 70  Temp: 99.6 F (37.6 C)  98.7 F (37.1 C) 98.7 F (37.1 C)  TempSrc: Oral  Oral Oral  Resp: 20  20 20   Height:      Weight:  118.434 kg (261 lb 1.6 oz)    SpO2: 98%  99% 94%    Intake/Output Summary (Last 24 hours) at 12/19/13 1041 Last data filed at 12/18/13 2110  Gross per 24 hour  Intake      0 ml  Output    225 ml  Net   -225 ml   Filed Weights   12/18/13 2021 12/19/13 0500  Weight: 81.647 kg (180 lb) 118.434 kg (261 lb 1.6 oz)    General appearance: alert, distracted and no distress Head: Normocephalic, without obvious abnormality, atraumatic Neck: no adenopathy, no carotid bruit, no JVD, supple, symmetrical, trachea midline and thyroid not enlarged, symmetric, no tenderness/mass/nodules Resp: Decreased air entry at the bases. No crackles or wheezing. Cardio: S1, S2 is normal, regular, systolic murmur appreciated over the aortic area. No rubs, or bruit. No pedal edema. No JVD. GI: soft, non-tender; bowel sounds normal; no masses,  no organomegaly Extremities:  Chronic skin changes in both the lower extremities. No obvious erythema. Previous amputations noted. Skin: Hyperpigmentation in the lower extremities Neurologic: She is alert. Distracted. No obvious facial symmetry. Able to move both her upper extremities. Difficult to elicit movement in the lower extremities, though she was seen moving it.  Lab Results:  Basic Metabolic Panel:  Recent Labs Lab 12/18/13 2052  NA 143  K 4.4  CL 101  CO2 28  GLUCOSE 172*  BUN 20  CREATININE 1.25*  CALCIUM 9.0   Liver Function Tests:  Recent Labs Lab 12/18/13 2052  AST 16  ALT 10  ALKPHOS 125*  BILITOT 0.3  PROT 8.6*  ALBUMIN 3.0*   CBC:  Recent Labs Lab 12/18/13 2052  WBC 18.0*  HGB 11.1*  HCT 35.1*  MCV 71.2*  PLT 413*   Cardiac Enzymes:  Recent Labs Lab 12/19/13 0450  TROPONINI <0.30   CBG:  Recent Labs Lab 12/18/13 2136 12/19/13 0256 12/19/13 0631  GLUCAP 157* 134* 118*    Studies/Results: Dg Chest 2 View  12/19/2013   CLINICAL DATA:  Acute onset of altered mental status and generalized weakness. Initial encounter.  EXAM: CHEST  2 VIEW  COMPARISON:  Chest radiograph performed 12/06/2013  FINDINGS: The lungs are hypoexpanded. Mild vascular crowding is noted. Mild left basilar atelectasis is seen. There is no evidence of pleural effusion or pneumothorax.  The heart is borderline enlarged. No acute osseous abnormalities are seen.  IMPRESSION:  Lungs hypoexpanded, with mild left basilar atelectasis. Borderline cardiomegaly.   Electronically Signed   By: Roanna RaiderJeffery  Chang M.D.   On: 12/19/2013 00:09   Ct Head Wo Contrast  12/18/2013   CLINICAL DATA:  Initial evaluation for acute altered mental status.  EXAM: CT HEAD WITHOUT CONTRAST  TECHNIQUE: Contiguous axial images were obtained from the base of the skull through the vertex without intravenous contrast.  COMPARISON:  Prior study from 12/06/2013  FINDINGS: No acute intracranial hemorrhage or large vessel territory infarct  identified. No mass lesion or midline shift. Ventricles are stable in size without evidence of hydrocephalus. No extra-axial fluid collection. Prominent dural and parafalcine calcifications again noted.  Calvarium is markedly thickened but intact. Scattered calcifications within the scalp are stable from prior. No acute scalp soft tissue abnormality. Orbits within normal limits.  Mild mucoperiosteal thickening present within the maxillary sinuses bilaterally, left greater than right. There are associated air-fluid levels, suggesting acute sinus disease. Scattered mucosal thickening present within the ethmoidal air cells bilaterally. Small air-fluid levels noted within the sphenoid sinuses as well. Mastoid air cells are well pneumatized and free of fluid.  IMPRESSION: 1. No acute intracranial process. 2. Scattered opacity with air-fluid levels within the maxillary and sphenoid sinuses bilaterally, suggesting acute sinus disease. This is slightly worsened relative to recent CT from 12/06/2013.   Electronically Signed   By: Rise MuBenjamin  McClintock M.D.   On: 12/18/2013 23:35    Medications:  Scheduled: . sodium chloride   Intravenous STAT  . antiseptic oral rinse  7 mL Mouth Rinse BID  . aspirin  325 mg Oral Daily  . atenolol  50 mg Oral Daily  . citalopram  20 mg Oral Daily  . heparin  5,000 Units Subcutaneous 3 times per day  . insulin aspart  0-9 Units Subcutaneous TID WC  . levothyroxine  125 mcg Oral QAC breakfast  . mirtazapine  15 mg Oral QHS  . multivitamin with minerals  1 tablet Oral Daily  . piperacillin-tazobactam (ZOSYN)  IV  3.375 g Intravenous 3 times per day  . QUEtiapine  50 mg Oral QHS  . sodium chloride  500 mL Intravenous Once  . sodium chloride  3 mL Intravenous Q12H  . vancomycin  1,000 mg Intravenous Q12H   Continuous:  RUE:[id number]:acetaminophen **OR** acetaminophen  Assessment/Plan:  Principal Problem:   Acute encephalopathy Active Problems:   Hypothyroidism   Hypertension    Diabetes mellitus without complication   Depression   Fever    Acute encephalopathy Etiology is not clear, but is likely due to infection considering elevated WBC. Does not appear to be CNS infection. I was also notified by the nurse that apparently the patient's Seroquel was started in the last 1-2 months and the dose was significantly increased recently. And her mental status has been altered ever since. Continue with Vanc/Zosyn for now. We will stop her Seroquel. Follow up on blood cultures.   Acute Sinusitis Continue antibiotics as discussed earlier.  History of Hypothyroidism TSH noted to be low. We will check free T4. Continue Synthroid.   History of DM-II Continue SSRI. Check A1c  Essential hypertension  On Lasix and atenolol at home. Continue atenolol. Hold Lasix  History of Depression Continue Celexa. We will hold her Seroquel as discussed above.  DVT Prophylaxis: Heparin    Code Status: Full code. Family Communication: No Family at bedside  Disposition Plan: Unclear for now. PT/OT.    LOS: 1 day   [facility] Hospital, IncorporatedKRISHNAN,Avary Eichenberger  [facility] Pager  [phone] 12/19/2013, 10:41 AM  If 8PM-8AM, please contact night-coverage at [email or web address], password [email or web address].

## 2013-12-19 NOTE — H&P (Addendum)
[facility] History and Physical  Halford ChessmanMary E Harl ZOX:[id number] DOB: Nov 29, 1960 DOA: 12/18/2013  Referring physician: ED physician PCP: No primary care provider on file.  Specialists:   Chief Complaint: AMS  HPI: Halford ChessmanMary E Kirsten Young is a 53 y.o. female with a past medical history of hypertension, diabetes mellitus, depression, hypothyroidism, who presents with altered mental status.  Patient has altered mental status. It is difficult to get accurate medical history. I tried to reach patient's daughter without success. History is obtained from ED report. It seems that patient's daughter came home at 1:30 and found that patient is altered. Pt would not answer question appropriately for ems. When I evaluated patient, she complains of left flank pain. She was found to have fever with temperature 100.3, leukocytosis with WBC 18.0, negative urinalysis, lactate 1.29. EKG showed T-wave inversion in V3-V5 (no old EKG to compare with). And chest x-ray showed lungs hypoexpanded, with mild left basilar atelectasis. CT-head without acute intracranial abnormalities, but with acute sinus disease. Patient seems not have nausea, vomiting, abdominal pain, diarrhea, incontinence.  Review of Systems: As presented in the history of presenting illness, rest negative.  Where does patient live?  At home Can patient participate in ADLs? Not sure?  Allergy: No Known Allergies  Past Medical History  Diagnosis Date  . Renal disorder   . Hypertension   . Diabetes mellitus without complication     Past Surgical History  Procedure Laterality Date  . Foot surgery      Social History:  reports that she has never smoked. She does not have any smokeless tobacco history on file. She reports that she drinks alcohol. She reports that she does not use illicit drugs.  Family History: could not obtained due to AMS  Prior to Admission medications   Medication Sig Start Date End Date Taking? Authorizing Provider  atenolol  (TENORMIN) 50 MG tablet Take 50 mg by mouth daily.   Yes Historical Provider, MD  citalopram (CELEXA) 20 MG tablet Take 20 mg by mouth daily.   Yes Historical Provider, MD  furosemide (LASIX) 40 MG tablet Take 40 mg by mouth daily.   Yes Historical Provider, MD  glimepiride (AMARYL) 4 MG tablet Take 4 mg by mouth daily with breakfast.   Yes Historical Provider, MD  levothyroxine (SYNTHROID, LEVOTHROID) 125 MCG tablet Take 125 mcg by mouth daily before breakfast.   Yes Historical Provider, MD  mirtazapine (REMERON) 15 MG tablet Take 15 mg by mouth at bedtime.   Yes Historical Provider, MD  Multiple Vitamins-Minerals (MULTIVITAMINS THER. W/MINERALS) TABS tablet Take 1 tablet by mouth daily.   Yes Historical Provider, MD  potassium chloride SA (K-DUR,KLOR-CON) 20 MEQ tablet Take 20 mEq by mouth daily.   Yes Historical Provider, MD  QUEtiapine (SEROQUEL) 50 MG tablet Take 50 mg by mouth at bedtime.   Yes Historical Provider, MD    Physical Exam: Filed Vitals:   12/19/13 0015 12/19/13 0100 12/19/13 0101 12/19/13 0126  BP: 114/51 116/52  138/80  Pulse: 64 62  64  Temp:   98.6 F (37 C)   TempSrc:   Oral   Resp: 21 16  22   Height:      Weight:      SpO2: 100% 100%  95%   General: Not in acute distress HEENT:       Eyes: PERRL, EOMI, no scleral icterus       ENT: No discharge from the ears and nose, no pharynx injection, no tonsillar enlargement.  Neck: No JVD, no bruit, no mass felt. Cardiac: S1/S2, RRR, No murmurs, No gallops or rubs Pulm: Good air movement bilaterally. Clear to auscultation bilaterally. No rales, wheezing, rhonchi or rubs. Abd: Soft, nondistended, nontender, no rebound pain, no organomegaly, BS present Ext: No edema bilaterally. 2+DP/PT pulse bilaterally Musculoskeletal: there is tenderness over the left flank area.  Skin: No rashes.  Neuro: Drowsy and not oriented X3, cranial nerves II-XII grossly intact, muscle strength 5/5 in all extremeties, Brachial reflex 2+  bilaterally. Knee reflex 1+ bilaterally. Negative Babinski's sign.  Psych: Patient is not psychotic  Labs on Admission:  Basic Metabolic Panel:  Recent Labs Lab 12/18/13 2052  NA 143  K 4.4  CL 101  CO2 28  GLUCOSE 172*  BUN 20  CREATININE 1.25*  CALCIUM 9.0   Liver Function Tests:  Recent Labs Lab 12/18/13 2052  AST 16  ALT 10  ALKPHOS 125*  BILITOT 0.3  PROT 8.6*  ALBUMIN 3.0*   No results for input(s): LIPASE, AMYLASE in the last 168 hours. No results for input(s): AMMONIA in the last 168 hours. CBC:  Recent Labs Lab 12/18/13 2052  WBC 18.0*  HGB 11.1*  HCT 35.1*  MCV 71.2*  PLT 413*   Cardiac Enzymes: No results for input(s): CKTOTAL, CKMB, CKMBINDEX, TROPONINI in the last 168 hours.  BNP (last 3 results) No results for input(s): PROBNP in the last 8760 hours. CBG:  Recent Labs Lab 12/18/13 2136  GLUCAP 157*    Radiological Exams on Admission: Dg Chest 2 View  12/19/2013   CLINICAL DATA:  Acute onset of altered mental status and generalized weakness. Initial encounter.  EXAM: CHEST  2 VIEW  COMPARISON:  Chest radiograph performed 12/06/2013  FINDINGS: The lungs are hypoexpanded. Mild vascular crowding is noted. Mild left basilar atelectasis is seen. There is no evidence of pleural effusion or pneumothorax.  The heart is borderline enlarged. No acute osseous abnormalities are seen.  IMPRESSION: Lungs hypoexpanded, with mild left basilar atelectasis. Borderline cardiomegaly.   Electronically Signed   By: Roanna Raider M.D.   On: 12/19/2013 00:09   Ct Head Wo Contrast  12/18/2013   CLINICAL DATA:  Initial evaluation for acute altered mental status.  EXAM: CT HEAD WITHOUT CONTRAST  TECHNIQUE: Contiguous axial images were obtained from the base of the skull through the vertex without intravenous contrast.  COMPARISON:  Prior study from 12/06/2013  FINDINGS: No acute intracranial hemorrhage or large vessel territory infarct identified. No mass lesion or  midline shift. Ventricles are stable in size without evidence of hydrocephalus. No extra-axial fluid collection. Prominent dural and parafalcine calcifications again noted.  Calvarium is markedly thickened but intact. Scattered calcifications within the scalp are stable from prior. No acute scalp soft tissue abnormality. Orbits within normal limits.  Mild mucoperiosteal thickening present within the maxillary sinuses bilaterally, left greater than right. There are associated air-fluid levels, suggesting acute sinus disease. Scattered mucosal thickening present within the ethmoidal air cells bilaterally. Small air-fluid levels noted within the sphenoid sinuses as well. Mastoid air cells are well pneumatized and free of fluid.  IMPRESSION: 1. No acute intracranial process. 2. Scattered opacity with air-fluid levels within the maxillary and sphenoid sinuses bilaterally, suggesting acute sinus disease. This is slightly worsened relative to recent CT from 12/06/2013.   Electronically Signed   By: Rise Mu M.D.   On: 12/18/2013 23:35    EKG: Independently reviewed.   Assessment/Plan Principal Problem:   Acute encephalopathy Active Problems:   Hypothyroidism  Hypertension   Diabetes mellitus without complication   Depression   Fever  Acute encephalopathy: Etiology is not clear, but is likely due to infection. The source of infection was not identified. Patient has leukocytosis and fever with temperature 100.3 on admission. Urinalysis was negative. Chest x-ray did not show infiltration. Patient does not have neck stiffness, making the meningitis unlikely the diagnosis.  - will admit to SDU - start vancomycin and Zosyn for broad coverage - Blood culture 2 - Check UDS - trop x 3 given TWI in V3 to V5 - repeat EKG in AM - ASA  Hypothyroidism: No TSH on record. 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  DVT ppx: SQ Heparin     Code Status: Full code Family Communication: None at bed side.  Disposition Plan: Admit to inpatient   Date of Service 12/19/2013    Lorretta HarpIU, Marissah Vandemark [facility] Pager [phone]  If 7PM-7AM, please contact night-coverage [email or web address] Password [email or web address] 12/19/2013, 1:40 AM

## 2013-12-19 NOTE — Evaluation (Signed)
Physical Therapy Evaluation Patient Details Name: Kirsten ChessmanMary E Langworthy MRN: [id number] DOB: 03/01/1960 Today's Date: 12/19/2013   History of Present Illness  [age] yo adm 12/19/13 with AMS, fever (unknown origin), - urine culture. PMHx- DM, HTN  Clinical Impression  Pt admitted with above symptoms (early this morning). No family present to confirm pt's baseline mobility. Pt returned to sitting without warning and currently recommend +2 assist for safety. Pt currently with functional limitations due to the deficits listed below (see PT Problem List).  Pt will benefit from skilled PT to increase their independence and safety with mobility to allow discharge to the venue listed below.       Follow Up Recommendations Supervision/Assistance - 24 hour (f/u PT needs TBA once baseline function clarified)    Equipment Recommendations  None recommended by PT    Recommendations for Other Services       Precautions / Restrictions Precautions Precautions: Fall Restrictions Weight Bearing Restrictions: No      Mobility  Bed Mobility Overal bed mobility: Needs Assistance Bed Mobility: Supine to Sit;Sit to Supine     Supine to sit: Min guard;HOB elevated Sit to supine: Supervision   General bed mobility comments: able to follow commands [facility] to supine  Transfers Overall transfer level: Needs assistance Equipment used: Rolling walker (2 wheeled) Transfers: Sit to/from Stand Sit to Stand: Mod assist         General transfer comment: x 2 from bed; quick "flop" to sit EOB after 5 seconds with no warning  Ambulation/Gait             General Gait Details: not tested due to need 2 people; able to march in place x 4 steps  Stairs            Wheelchair Mobility    Modified Rankin (Stroke Patients Only)       Balance Overall balance assessment: Needs assistance Sitting-balance support: No upper extremity supported;Feet supported Sitting balance-Leahy  Scale: Fair     Standing balance support: Bilateral upper extremity supported Standing balance-Leahy Scale: Poor                               Pertinent Vitals/Pain Pain Assessment: No/denies pain    Home Living Family/patient expects to be discharged to:: Unsure Living Arrangements: Children (dtr, son in Social workerlaw, grandson) Available Help at Discharge: Family;Available PRN/intermittently (nearly 24/7; alone at most an hour ) Type of Home: House Home Access: Stairs to enter     Home Layout: One level Home Equipment: Environmental consultantWalker - 2 wheels Additional Comments: all info from pt with ?reliability    Prior Function Level of Independence: Needs assistance   Gait / Transfers Assistance Needed: at times modified independent with RW; assist when weak; assist on steps           Hand Dominance        Extremity/Trunk Assessment   Upper Extremity Assessment: Overall WFL for tasks assessed           Lower Extremity Assessment: Generalized weakness      Cervical / Trunk Assessment: Normal  Communication   Communication: HOH  Cognition Arousal/Alertness: Lethargic (initially, then more alert) Behavior During Therapy: WFL for tasks assessed/performed Overall Cognitive Status: No family/caregiver present to determine baseline cognitive functioning                      General Comments  General comments (skin integrity, edema, etc.): Seemingly appropriate answers when she can hear you.    Exercises        Assessment/Plan    PT Assessment Patient needs continued PT services  PT Diagnosis Generalized weakness;Altered mental status   PT Problem List Decreased strength;Decreased activity tolerance;Decreased balance;Decreased mobility;Decreased cognition;Decreased knowledge of use of DME;Obesity  PT Treatment Interventions DME instruction;Gait training;Stair training;Functional mobility training;Therapeutic activities;Therapeutic exercise;Balance  training;Cognitive remediation;Patient/family education   PT Goals (Current goals can be found in the Care Plan section) Acute Rehab PT Goals Patient Stated Goal: pt unable but agreed to work on strengthening PT Goal Formulation: Patient unable to participate in goal setting Time For Goal Achievement: 12/26/13 Potential to Achieve Goals: Fair    Frequency Min 3X/week   Barriers to discharge        Co-evaluation               End of Session Equipment Utilized During Treatment: Gait belt Activity Tolerance: Patient limited by fatigue Patient left: in bed;with call bell/phone within reach;with bed alarm set Nurse Communication: Mobility status (called for soft touch call bell due to low vision)         Time: [id number] PT Time Calculation (min) (ACUTE ONLY): 12 min   Charges:   PT Evaluation $Initial PT Evaluation Tier I: 1 Procedure     PT G Codes:          Torie Priebe 12/19/2013, 11:51 AM Pager [phone]

## 2013-12-20 DIAGNOSIS — E039 Hypothyroidism, unspecified: Secondary | ICD-10-CM

## 2013-12-20 DIAGNOSIS — R609 Edema, unspecified: Secondary | ICD-10-CM

## 2013-12-20 DIAGNOSIS — J0100 Acute maxillary sinusitis, unspecified: Secondary | ICD-10-CM

## 2013-12-20 DIAGNOSIS — E119 Type 2 diabetes mellitus without complications: Secondary | ICD-10-CM

## 2013-12-20 LAB — BASIC METABOLIC PANEL
ANION GAP: 17 — AB (ref 5–15)
BUN: 20 mg/dL (ref 6–23)
CHLORIDE: 104 meq/L (ref 96–112)
CO2: 24 meq/L (ref 19–32)
CREATININE: 1.31 mg/dL — AB (ref 0.50–1.10)
Calcium: 8.3 mg/dL — ABNORMAL LOW (ref 8.4–10.5)
GFR calc non Af Amer: 46 mL/min — ABNORMAL LOW (ref >90)
GFR, EST AFRICAN AMERICAN: 54 mL/min — AB (ref >90)
Glucose, Bld: 83 mg/dL (ref 70–99)
POTASSIUM: 4 meq/L (ref 3.7–5.3)
Sodium: 145 mEq/L (ref 137–147)

## 2013-12-20 LAB — CBC
HEMATOCRIT: 30.4 % — AB (ref 36.0–46.0)
Hemoglobin: 9.4 g/dL — ABNORMAL LOW (ref 12.0–15.0)
MCH: 21.8 pg — ABNORMAL LOW (ref 26.0–34.0)
MCHC: 30.9 g/dL (ref 30.0–36.0)
MCV: 70.4 fL — ABNORMAL LOW (ref 78.0–100.0)
PLATELETS: 297 10*3/uL (ref 150–400)
RBC: 4.32 MIL/uL (ref 3.87–5.11)
RDW: 14.2 % (ref 11.5–15.5)
WBC: 6.4 10*3/uL (ref 4.0–10.5)

## 2013-12-20 LAB — RAPID URINE DRUG SCREEN, HOSP PERFORMED
Amphetamines: NOT DETECTED
BARBITURATES: NOT DETECTED
Benzodiazepines: NOT DETECTED
Cocaine: NOT DETECTED
Opiates: NOT DETECTED
Tetrahydrocannabinol: NOT DETECTED

## 2013-12-20 LAB — GLUCOSE, CAPILLARY
GLUCOSE-CAPILLARY: 179 mg/dL — AB (ref 70–99)
GLUCOSE-CAPILLARY: 133 mg/dL — AB (ref 70–99)
Glucose-Capillary: 115 mg/dL — ABNORMAL HIGH (ref 70–99)

## 2013-12-20 MED ORDER — SODIUM CHLORIDE 0.9 % IV SOLN
INTRAVENOUS | Status: AC
  Administered 2013-12-20: 10:00:00 via INTRAVENOUS

## 2013-12-20 NOTE — Progress Notes (Signed)
  Echocardiogram 2D Echocardiogram has been performed.  Kirsten Young, Kirsten Young M 12/20/2013, 9:30 AM

## 2013-12-20 NOTE — Progress Notes (Signed)
VASCULAR LAB PRELIMINARY  PRELIMINARY  PRELIMINARY  PRELIMINARY  Bilateral lower extremity venous Doppler completed.    Preliminary report:  There is no DVT or SVT noted in the bilateral lower extremities.    Hodges Treiber, RVT 12/20/2013, 9:14 AM

## 2013-12-20 NOTE — Progress Notes (Signed)
[facility] PROGRESS NOTE  [name] ZOX:[id number] DOB: 1960/05/17 DOA: 12/18/2013  PCP: Ardelia MemsUnable to determine at this time  Brief HPI: This is a 53 year old African-American female with a past medical history of hypertension, diabetes, hypothyroidism, who presented with altered mental status. She was noted to have a temperature of 100.3. Apparently was complaining of flank pain. CT head suggested acute sinusitis. No obvious focal neurological deficits were noted. Patient has visual and hearing impairments at baseline.  Past medical history:  Past Medical History  Diagnosis Date  . Renal disorder   . Hypertension   . Diabetes mellitus without complication     Consultants: None  Procedures: None  Antibiotics: Vancomycin and Zosyn 12/2  Subjective: Patient is more alert today. Denies any pain. Hearing impairment makes it difficult to have a conversation.   Objective: Vital Signs  Filed Vitals:   12/19/13 1722 12/19/13 2008 12/20/13 0044 12/20/13 0545  BP: 115/56 107/43 103/46 104/43  Pulse: 69 64 66 61  Temp: 98.2 F (36.8 C) 98.8 F (37.1 C) 98.8 F (37.1 C) 98.4 F (36.9 C)  TempSrc: Oral Oral Oral Oral  Resp: 18 18 18 18   Height:      Weight:      SpO2: 99% 97% 99% 97%   No intake or output data in the 24 hours ending 12/20/13 0743 Filed Weights   12/18/13 2021 12/19/13 0500  Weight: 81.647 kg (180 lb) 118.434 kg (261 lb 1.6 oz)    General appearance: alert, distracted and no distress Head: Normocephalic, without obvious abnormality, atraumatic Resp: Decreased air entry at the bases. No crackles or wheezing. Cardio: S1, S2 is normal, regular, systolic murmur appreciated over the aortic area. No rubs, or bruit. No pedal edema. No JVD. GI: soft, non-tender; bowel sounds normal; no masses,  no organomegaly Extremities: Chronic skin changes in both the lower extremities. No obvious erythema. Previous toe amputations noted. Swelling noted. Skin:  Hyperpigmentation in the lower extremities Neurologic: She is alert. No obvious facial symmetry. Able to move both her upper extremities. Better mobility of lower legs.   Lab Results:  Basic Metabolic Panel:  Recent Labs Lab 12/18/13 2052 12/19/13 1100 12/20/13 0509  NA 143 144 145  K 4.4 3.9 4.0  CL 101 103 104  CO2 28 27 24   GLUCOSE 172* 133* 83  BUN 20 21 20   CREATININE 1.25* 1.28* 1.31*  CALCIUM 9.0 8.6 8.3*   Liver Function Tests:  Recent Labs Lab 12/18/13 2052 12/19/13 1100  AST 16 15  ALT 10 8  ALKPHOS 125* 112  BILITOT 0.3 0.4  PROT 8.6* 8.0  ALBUMIN 3.0* 2.8*   CBC:  Recent Labs Lab 12/18/13 2052 12/19/13 1100 12/20/13 0509  WBC 18.0* 10.0 6.4  HGB 11.1* 9.8* 9.4*  HCT 35.1* 31.7* 30.4*  MCV 71.2* 70.8* 70.4*  PLT 413* 324 297   Cardiac Enzymes:  Recent Labs Lab 12/19/13 0450 12/19/13 1100 12/19/13 1420  TROPONINI <0.30 <0.30 <0.30   CBG:  Recent Labs Lab 12/19/13 0256 12/19/13 0631 12/19/13 1149 12/19/13 1640 12/20/13 0628  GLUCAP 134* 118* 128* 120* 115*    Studies/Results: Dg Chest 2 View  12/19/2013   CLINICAL DATA:  Acute onset of altered mental status and generalized weakness. Initial encounter.  EXAM: CHEST  2 VIEW  COMPARISON:  Chest radiograph performed 12/06/2013  FINDINGS: The lungs are hypoexpanded. Mild vascular crowding is noted. Mild left basilar atelectasis is seen. There is no evidence of pleural effusion or pneumothorax.  The heart is borderline enlarged. No acute osseous abnormalities are seen.  IMPRESSION: Lungs hypoexpanded, with mild left basilar atelectasis. Borderline cardiomegaly.   Electronically Signed   By: Roanna RaiderJeffery  Chang M.D.   On: 12/19/2013 00:09   Ct Head Wo Contrast  12/18/2013   CLINICAL DATA:  Initial evaluation for acute altered mental status.  EXAM: CT HEAD WITHOUT CONTRAST  TECHNIQUE: Contiguous axial images were obtained from the base of the skull through the vertex without intravenous contrast.   COMPARISON:  Prior study from 12/06/2013  FINDINGS: No acute intracranial hemorrhage or large vessel territory infarct identified. No mass lesion or midline shift. Ventricles are stable in size without evidence of hydrocephalus. No extra-axial fluid collection. Prominent dural and parafalcine calcifications again noted.  Calvarium is markedly thickened but intact. Scattered calcifications within the scalp are stable from prior. No acute scalp soft tissue abnormality. Orbits within normal limits.  Mild mucoperiosteal thickening present within the maxillary sinuses bilaterally, left greater than right. There are associated air-fluid levels, suggesting acute sinus disease. Scattered mucosal thickening present within the ethmoidal air cells bilaterally. Small air-fluid levels noted within the sphenoid sinuses as well. Mastoid air cells are well pneumatized and free of fluid.  IMPRESSION: 1. No acute intracranial process. 2. Scattered opacity with air-fluid levels within the maxillary and sphenoid sinuses bilaterally, suggesting acute sinus disease. This is slightly worsened relative to recent CT from 12/06/2013.   Electronically Signed   By: Rise MuBenjamin  McClintock M.D.   On: 12/18/2013 23:35    Medications:  Scheduled: . antiseptic oral rinse  7 mL Mouth Rinse BID  . aspirin  325 mg Oral Daily  . atenolol  50 mg Oral Daily  . citalopram  20 mg Oral Daily  . heparin  5,000 Units Subcutaneous 3 times per day  . insulin aspart  0-9 Units Subcutaneous TID WC  . levothyroxine  125 mcg Oral QAC breakfast  . mirtazapine  15 mg Oral QHS  . multivitamin with minerals  1 tablet Oral Daily  . piperacillin-tazobactam (ZOSYN)  IV  3.375 g Intravenous 3 times per day  . sodium chloride  500 mL Intravenous Once  . sodium chloride  3 mL Intravenous Q12H  . vancomycin  1,000 mg Intravenous Q12H   Continuous:  ZOX:[id number]:acetaminophen **OR** acetaminophen  Assessment/Plan:  Principal Problem:   Acute  encephalopathy Active Problems:   Hypothyroidism   Hypertension   Diabetes mellitus without complication   Depression   Fever   Acute sinusitis    Acute encephalopathy Seems better today. Etiology is not clear, but is likely due to infection considering elevated WBC or medication (seroquel). Does not appear to be CNS infection. Apparently the patient's Seroquel was started in the last 1-2 months and the dose was significantly increased recently. And her mental status has been altered ever since. Continue with Vanc/Zosyn for now. We have stopped her Seroquel. Follow up on blood cultures. Change antibiotics once culture reports are available.  Acute Sinusitis Continue antibiotics as discussed earlier.  History of Hypothyroidism TSH noted to be low. Free T4 is normal. Continue Synthroid.   History of DM-II Continue SSI. HBA1c 6.3.  Essential hypertension  On Lasix and atenolol at home. Continue atenolol. Holding Lasix  History of Depression Continue Celexa. We are holding her Seroquel as discussed above.  DVT Prophylaxis: Heparin    Code Status: Full code. Family Communication: No Family at bedside. Number listed in chart is disconnected. Disposition Plan: PT/OT following.     LOS: 2  days   [facility]  [facility] Pager [phone] 12/20/2013, 7:43 AM  If 8PM-8AM, please contact night-coverage at [email or web address], password [email or web address].

## 2013-12-20 NOTE — Progress Notes (Signed)
OT Cancellation Note  Patient Details Name: Kirsten Young MRN: [id number] DOB: 1960-09-05   Cancelled Treatment:    Reason Eval/Treat Not Completed: Patient at procedure or test/ unavailable (Vascular)  Nils PyleBermel, Arali Somera 12/20/2013, 9:02 AM

## 2013-12-20 NOTE — Progress Notes (Signed)
Physical Therapy Treatment Patient Details Name: Halford ChessmanMary E Messineo MRN: [id number] DOB: 1961-01-03 Today's Date: 12/20/2013    History of Present Illness 53 yo adm 12/19/13 with AMS, fever (unknown origin), - urine culture. PMHx- DM, HTN    PT Comments    Patient able to walk and is progressing well with overall therapy. Continued to be unsure of patient family support and patient will need 24/7 assist at home. At this time recommend SNF for ongoing therapy prior to DC home. Will continue with current POC  Follow Up Recommendations  Supervision/Assistance - 24 hour     Equipment Recommendations  None recommended by PT    Recommendations for Other Services       Precautions / Restrictions Precautions Precautions: Fall    Mobility  Bed Mobility Overal bed mobility: Needs Assistance Bed Mobility: Supine to Sit;Sit to Supine     Supine to sit: Min guard;HOB elevated     General bed mobility comments: Minguard for safety. Cues for positioning  Transfers Overall transfer level: Needs assistance Equipment used: Rolling walker (2 wheeled)   Sit to Stand: Mod assist;+2 safety/equipment         General transfer comment: Stood x2 with mod A to ensure balance and safety. Cues for anterior weight shift and for proper hand placement  Ambulation/Gait Ambulation/Gait assistance: Mod assist;+2 safety/equipment Ambulation Distance (Feet): 50 Feet (x2) Assistive device: Rolling walker (2 wheeled) Gait Pattern/deviations: Step-to pattern;Decreased step length - right;Decreased step length - left;Shuffle     General Gait Details: A for balance and patient unsteady with LE weakness and buckling with ambulation. Cues for safe use of RW and for pathfinding and patient unable to see fully where she is going   Information systems managertairs            Wheelchair Mobility    Modified Rankin (Stroke Patients Only)       Balance     Sitting balance-Leahy Scale: Fair       Standing balance-Leahy  Scale: Poor                      Cognition Arousal/Alertness: Awake/alert Behavior During Therapy: WFL for tasks assessed/performed Overall Cognitive Status: No family/caregiver present to determine baseline cognitive functioning       Memory: Decreased short-term memory              Exercises      General Comments        Pertinent Vitals/Pain Pain Assessment: No/denies pain    Home Living                      Prior Function            PT Goals (current goals can now be found in the care plan section) Progress towards PT goals: Progressing toward goals    Frequency  Min 3X/week    PT Plan Current plan remains appropriate    Co-evaluation             End of Session Equipment Utilized During Treatment: Gait belt Activity Tolerance: Patient tolerated treatment well Patient left: in chair;with call bell/phone within reach;with chair alarm set     Time: 1030-1058 PT Time Calculation (min) (ACUTE ONLY): 28 min  Charges:  $Gait Training: 8-22 mins $Therapeutic Activity: 8-22 mins                    G Codes:      Robinette, Amil AmenJulia  Elizabeth 12/20/2013, 2:25 PM  12/20/2013 Fredrich Birksobinette, Julia Elizabeth PTA [phone] pager [phone] office

## 2013-12-21 DIAGNOSIS — I10 Essential (primary) hypertension: Secondary | ICD-10-CM

## 2013-12-21 LAB — BASIC METABOLIC PANEL
Anion gap: 12 (ref 5–15)
BUN: 18 mg/dL (ref 6–23)
CO2: 28 meq/L (ref 19–32)
Calcium: 8.2 mg/dL — ABNORMAL LOW (ref 8.4–10.5)
Chloride: 103 mEq/L (ref 96–112)
Creatinine, Ser: 1.3 mg/dL — ABNORMAL HIGH (ref 0.50–1.10)
GFR calc Af Amer: 54 mL/min — ABNORMAL LOW (ref >90)
GFR calc non Af Amer: 47 mL/min — ABNORMAL LOW (ref >90)
GLUCOSE: 81 mg/dL (ref 70–99)
POTASSIUM: 3.7 meq/L (ref 3.7–5.3)
SODIUM: 143 meq/L (ref 137–147)

## 2013-12-21 LAB — CBC
HCT: 29.6 % — ABNORMAL LOW (ref 36.0–46.0)
HEMOGLOBIN: 9.2 g/dL — AB (ref 12.0–15.0)
MCH: 21.7 pg — ABNORMAL LOW (ref 26.0–34.0)
MCHC: 31.1 g/dL (ref 30.0–36.0)
MCV: 69.8 fL — ABNORMAL LOW (ref 78.0–100.0)
Platelets: 285 10*3/uL (ref 150–400)
RBC: 4.24 MIL/uL (ref 3.87–5.11)
RDW: 14.1 % (ref 11.5–15.5)
WBC: 5.7 10*3/uL (ref 4.0–10.5)

## 2013-12-21 LAB — GLUCOSE, CAPILLARY
GLUCOSE-CAPILLARY: 86 mg/dL (ref 70–99)
Glucose-Capillary: 126 mg/dL — ABNORMAL HIGH (ref 70–99)

## 2013-12-21 MED ORDER — AMOXICILLIN-POT CLAVULANATE 875-125 MG PO TABS
ORAL_TABLET | ORAL | Status: AC
  Filled 2013-12-21: qty 1

## 2013-12-21 MED ORDER — PNEUMOCOCCAL VAC POLYVALENT 25 MCG/0.5ML IJ INJ: 0.5000 mL | INJECTION | INTRAMUSCULAR | Status: DC | PRN

## 2013-12-21 MED ORDER — INFLUENZA VAC SPLIT QUAD 0.5 ML IM SUSY: 0.5000 mL | PREFILLED_SYRINGE | INTRAMUSCULAR | Status: DC | PRN

## 2013-12-21 MED ORDER — AMOXICILLIN-POT CLAVULANATE 875-125 MG PO TABS
1.0000 | ORAL_TABLET | Freq: Two times a day (BID) | ORAL | Status: DC
  Administered 2013-12-21 (×2): 1 via ORAL

## 2013-12-21 NOTE — Progress Notes (Signed)
Occupational Therapy Evaluation Patient Details Name: Kirsten ChessmanMary E Young MRN: [id number] DOB: Jan 20, 1960 Today's Date: 12/21/2013    History of Present Illness [age] yo female admitted 12/19/13 with AMS and fever. CT (+) acute sinusitis. Visual and hearing impairments at baseline. Dx acutely with encephalopathy  PMH= DM, HTN   Clinical Impression   Pt was confused during session and was unable to provide any home or PLOF information. Pt is also extremely HOH. Pt completed sit<>stand transfers and ambulation with mod +2 assist for balance. Pt needs max verbal cues for safe use of RW and for descend to surfaces as she is trying to sit down before it is safe despite cues. Recommending SNF placement to increase safety and independence with ADLs and functional mobility. PT unable to reach family member yesterday, will attempt to contact family to determine home and anticipated discharge information.    Follow Up Recommendations  SNF;Supervision/Assistance - 24 hour    Equipment Recommendations  Other (comment) (TBD with family)    Recommendations for Other Services       Precautions / Restrictions Precautions Precautions: Fall Restrictions Weight Bearing Restrictions: No      Mobility Bed Mobility Overal bed mobility: Needs Assistance Bed Mobility: Supine to Sit     Supine to sit: Min guard;HOB elevated     General bed mobility comments: HOB elevated, no use of bedrails.   Transfers Overall transfer level: Needs assistance Equipment used: Rolling walker (2 wheeled) Transfers: Sit to/from Stand Sit to Stand: Mod assist;+2 physical assistance         General transfer comment: mod +2 (A) for boost to stand and to stabilize balance. Cues for anterior weight shift    Balance Overall balance assessment: Needs assistance Sitting-balance support: No upper extremity supported;Feet supported Sitting balance-Leahy Scale: Fair     Standing balance support: Bilateral upper extremity  supported;During functional activity Standing balance-Leahy Scale: Poor                              ADL Overall ADL's : Needs assistance/impaired     Grooming: Wash/dry hands;Wash/dry face;Applying deodorant;Brushing hair;Moderate assistance;Standing;Sitting;Cueing for safety Grooming Details (indicate cue type and reason): Verbal cues for safety with RW use while standing at sink. Upper Body Bathing: Min guard;Sitting               Toilet Transfer: Moderate assistance;+2 for physical assistance;Cueing for safety;Cueing for sequencing;Ambulation;RW;Grab bars Toilet Transfer Details (indicate cue type and reason): Mod +2 (A) for balance. Verbal cues for safe hand placement and RW use. Pt pushing RW out of the way and held onto grab bar with both hands instead despite max verbal cues to use RW. Directional verbal cues for location of toilet prior to descend. Pt attempting to sit before seeing/feeling toilet behind her. Toileting- Clothing Manipulation and Hygiene: Minimal assistance;+2 for physical assistance;Cueing for safety;Sit to/from stand Toileting - Clothing Manipulation Details (indicate cue type and reason): Min +2 (A) for balance while standing for clothing manipulation and peri care     Functional mobility during ADLs: Moderate assistance;+2 for physical assistance;Cueing for safety;Cueing for sequencing;Rolling walker General ADL Comments: Pt is very impulsive and attempting to stand from bed/BSC without (A). Required max verbal cues for safe use of RW. Pt attempting to push RW out of the way during ADLs in bathroom and use grab bars for balance. Pt required mod +2 (A) for all transfers and ambulation for boost to stand and  for balance     Vision                 Additional Comments: Difficult to assess due to cognition   Perception     Praxis      Pertinent Vitals/Pain Pain Assessment: No/denies pain     Hand Dominance     Extremity/Trunk  Assessment Upper Extremity Assessment Upper Extremity Assessment: Generalized weakness;Difficult to assess due to impaired cognition       Cervical / Trunk Assessment Cervical / Trunk Assessment: Kyphotic   Communication Communication Communication: HOH   Cognition Arousal/Alertness: Awake/alert Behavior During Therapy: Impulsive Overall Cognitive Status: No family/caregiver present to determine baseline cognitive functioning       Memory: Decreased short-term memory             General Comments       Exercises       Shoulder Instructions      Home Living                                   Additional Comments: Pt unable to provide information      Prior Functioning/Environment          Comments: Pt confused and unable to provide information    OT Diagnosis: Generalized weakness;Cognitive deficits;Disturbance of vision   OT Problem List: Decreased strength;Decreased range of motion;Decreased activity tolerance;Impaired balance (sitting and/or standing);Impaired vision/perception;Decreased coordination;Decreased cognition;Decreased safety awareness;Decreased knowledge of use of DME or AE;Decreased knowledge of precautions;Impaired sensation;Obesity;Increased edema   OT Treatment/Interventions: Self-care/ADL training;Therapeutic exercise;DME and/or AE instruction;Therapeutic activities;Cognitive remediation/compensation;Visual/perceptual remediation/compensation;Patient/family education;Balance training    OT Goals(Current goals can be found in the care plan section) Acute Rehab OT Goals Patient Stated Goal: none stated OT Goal Formulation: Patient unable to participate in goal setting Time For Goal Achievement: 01/04/14 Potential to Achieve Goals: Good ADL Goals Pt Will Perform Grooming: with supervision;standing Pt Will Perform Upper Body Bathing: with supervision;sitting Pt Will Perform Lower Body Bathing: with supervision;sit to/from  stand Pt Will Transfer to Toilet: with min guard assist;ambulating;regular height toilet;grab bars Pt Will Perform Toileting - Clothing Manipulation and hygiene: with min guard assist;sit to/from stand  OT Frequency: Min 3X/week   Barriers to D/C:            Co-evaluation              End of Session Equipment Utilized During Treatment: Gait belt;Rolling walker Nurse Communication: Mobility status;Precautions  Activity Tolerance: Patient tolerated treatment well Patient left: in chair;with call bell/phone within reach;with chair alarm set   Time: 1005-1031 OT Time Calculation (min): 26 min Charges:    G-Codes:    Nils PyleBermel, Ruel Dimmick 12/21/2013, 10:58 AM

## 2013-12-21 NOTE — Care Management Note (Signed)
Patient is active with [facility] for RN & PT.  Resumption of care orders requested upon discharge. °

## 2013-12-21 NOTE — Plan of Care (Signed)
Problem: Phase II Progression Outcomes Goal: Progress activity as tolerated unless otherwise ordered Outcome: Progressing Goal: Discharge plan established Outcome: Progressing Goal: Vital signs remain stable Outcome: Progressing Goal: IV changed to normal saline lock Outcome: Progressing Goal: Obtain order to discontinue catheter if appropriate Outcome: Not Applicable Date Met:  02/28/13

## 2013-12-21 NOTE — Progress Notes (Signed)
[facility] PROGRESS NOTE  Kirsten Young ZOX:[id number] DOB: 1960-02-12 DOA: 12/18/2013  PCP: Ardelia MemsUnable to determine at this time  Brief HPI: This is a 53 year old African-American female with a past medical history of hypertension, diabetes, hypothyroidism, who presented with altered mental status. She was noted to have a temperature of 100.3. Apparently was complaining of flank pain. CT head suggested acute sinusitis. No obvious focal neurological deficits were noted. Patient has visual and hearing impairments at baseline.  Past medical history:  Past Medical History  Diagnosis Date  . Renal disorder   . Hypertension   . Diabetes mellitus without complication     Consultants: None  Procedures:  2D ECHO Study Conclusions - Left ventricle: The cavity size was normal. Systolic function wasnormal. The estimated ejection fraction was in the range of 55%to 60%. Although no diagnostic regional wall motion abnormalitywas identified, this possibility cannot be completely excluded onthe basis of this study. - Pulmonary arteries: Systolic pressure was moderately increased.PA peak pressure: 50 mm Hg (S).  LE Venous Doppler No DVT  Antibiotics: Vancomycin and Zosyn 12/2-->12/4 Augmentin 12/4-->  Subjective: Patient same as yesterday. Asking for her daughter. Hearing impairment makes it difficult to have a conversation.   Objective: Vital Signs  Filed Vitals:   12/20/13 1248 12/20/13 2120 12/21/13 0100 12/21/13 0500  BP: 106/40 112/51 124/58 136/76  Pulse: 61 67 68 88  Temp: 98.1 F (36.7 C) 98.2 F (36.8 C) 98.3 F (36.8 C) 98.4 F (36.9 C)  TempSrc: Oral Oral Oral Oral  Resp: 20 20 20 20   Height:      Weight:      SpO2: 97% 99% 98% 99%    Intake/Output Summary (Last 24 hours) at 12/21/13 0748 Last data filed at 12/20/13 1241  Gross per 24 hour  Intake    240 ml  Output      0 ml  Net    240 ml   Filed Weights   12/18/13 2021 12/19/13 0500  Weight: 81.647  kg (180 lb) 118.434 kg (261 lb 1.6 oz)    General appearance: alert, distracted and no distress Resp: Decreased air entry at the bases. No crackles or wheezing. Cardio: S1, S2 is normal, regular, systolic murmur appreciated over the aortic area. No rubs, or bruit. No pedal edema. No JVD. GI: soft, non-tender; bowel sounds normal; no masses,  no organomegaly Extremities: Chronic skin changes in both the lower extremities. No obvious erythema. Previous toe amputations noted. Swelling noted. Skin: Hyperpigmentation in the lower extremities Neurologic: She is alert. Unable to test orientation. Less distracted. Impaired vision. No obvious facial symmetry. Able to move both her upper extremities. Better mobility of lower legs.   Lab Results:  Basic Metabolic Panel:  Recent Labs Lab 12/18/13 2052 12/19/13 1100 12/20/13 0509 12/21/13 0525  NA 143 144 145 143  K 4.4 3.9 4.0 3.7  CL 101 103 104 103  CO2 28 27 24 28   GLUCOSE 172* 133* 83 81  BUN 20 21 20 18   CREATININE 1.25* 1.28* 1.31* 1.30*  CALCIUM 9.0 8.6 8.3* 8.2*   Liver Function Tests:  Recent Labs Lab 12/18/13 2052 12/19/13 1100  AST 16 15  ALT 10 8  ALKPHOS 125* 112  BILITOT 0.3 0.4  PROT 8.6* 8.0  ALBUMIN 3.0* 2.8*   CBC:  Recent Labs Lab 12/18/13 2052 12/19/13 1100 12/20/13 0509 12/21/13 0525  WBC 18.0* 10.0 6.4 5.7  HGB 11.1* 9.8* 9.4* 9.2*  HCT 35.1* 31.7* 30.4* 29.6*  MCV  71.2* 70.8* 70.4* 69.8*  PLT 413* 324 297 285   Cardiac Enzymes:  Recent Labs Lab 12/19/13 0450 12/19/13 1100 12/19/13 1420  TROPONINI <0.30 <0.30 <0.30   CBG:  Recent Labs Lab 12/19/13 1149 12/19/13 1640 12/20/13 0628 12/20/13 1207 12/20/13 1700  GLUCAP 128* 120* 115* 179* 133*    Studies/Results: No results found.  Medications:  Scheduled: . amoxicillin-clavulanate  1 tablet Oral Q12H  . antiseptic oral rinse  7 mL Mouth Rinse BID  . aspirin  325 mg Oral Daily  . atenolol  50 mg Oral Daily  . citalopram  20  mg Oral Daily  . heparin  5,000 Units Subcutaneous 3 times per day  . insulin aspart  0-9 Units Subcutaneous TID WC  . levothyroxine  125 mcg Oral QAC breakfast  . mirtazapine  15 mg Oral QHS  . multivitamin with minerals  1 tablet Oral Daily  . sodium chloride  3 mL Intravenous Q12H   Continuous:  ZOX:[id number]:acetaminophen **OR** acetaminophen  Assessment/Plan:  Principal Problem:   Acute encephalopathy Active Problems:   Hypothyroidism   Hypertension   Diabetes mellitus without complication   Depression   Fever   Acute sinusitis    Acute encephalopathy Remains stable. Has improved from admission. Etiology is not clear, but is likely due to infection considering elevated WBC or medication (seroquel). Does not appear to be CNS infection. Apparently the patient's Seroquel was started in the last 1-2 months and the dose was significantly increased recently. And her mental status has been altered ever since. We have stopped her Seroquel. Blood cultures negative so far.   Acute Sinusitis Change to oral antibiotics.   History of Hypothyroidism TSH noted to be low. Free T4 is normal. Continue Synthroid.   History of DM-II Continue SSI. HBA1c 6.3.  Essential hypertension  On Lasix and atenolol at home. Continue atenolol. Holding Lasix. ECHo as above. Pulmonary hypertension noted. Will need to pursued as OP.  History of Depression Continue Celexa. We are holding her Seroquel as discussed above.  DVT Prophylaxis: Heparin    Code Status: Full code. Family Communication: No Family at bedside. Number listed in chart is disconnected. CSW to investigate further. Disposition Plan: PT/OT following but unable to give concrete recommendations as no family has been available to provide information regarding her baseline and living situation. Patient cannot be safely discharged at this time.    LOS: 3 days   [facility] Mac[facility] HospitalKRISHNAN,Kallum Jorgensen  [facility] Pager [phone] 12/21/2013, 7:48 AM  If  8PM-8AM, please contact night-coverage at [email or web address], password [email or web address].

## 2013-12-21 NOTE — Progress Notes (Addendum)
NCM spoke to pt and states she lives at home with dtr. States she has RW at home. Requesting 3n1 for home. Pt states she had HH in the past. Pt did not have [facility]. Will verify with [facility] Isidoro DonningAlesia Stephanie Mcglone RN CCM Case Mgmt phone [phone]   Medicare Important Message given? YES  (If response is "NO", the following Medicare IM given date fields will be blank)  Date Medicare IM given:  12/21/2013 Medicare IM given by: Daleen BoSHAVIS, Markie Frith Chantelle Verdi RN CCM Case Mgmt phone [phone]

## 2013-12-21 NOTE — Progress Notes (Signed)
Pt's daughter in room. Daughter states that pt requests to d/c to home and will have 24 hour supervision there. Pt has hospital bed, rolling walker, and bedside commode at home, along with home health RN and PT 2-3 times a week.

## 2013-12-22 LAB — CBC
HEMATOCRIT: 31.7 % — AB (ref 36.0–46.0)
Hemoglobin: 9.9 g/dL — ABNORMAL LOW (ref 12.0–15.0)
MCH: 21.9 pg — ABNORMAL LOW (ref 26.0–34.0)
MCHC: 31.2 g/dL (ref 30.0–36.0)
MCV: 70.1 fL — ABNORMAL LOW (ref 78.0–100.0)
Platelets: 346 10*3/uL (ref 150–400)
RBC: 4.52 MIL/uL (ref 3.87–5.11)
RDW: 14.2 % (ref 11.5–15.5)
WBC: 5 10*3/uL (ref 4.0–10.5)

## 2013-12-22 LAB — BASIC METABOLIC PANEL
ANION GAP: 11 (ref 5–15)
BUN: 19 mg/dL (ref 6–23)
CALCIUM: 9.1 mg/dL (ref 8.4–10.5)
CO2: 30 mEq/L (ref 19–32)
CREATININE: 1.19 mg/dL — AB (ref 0.50–1.10)
Chloride: 101 mEq/L (ref 96–112)
GFR, EST AFRICAN AMERICAN: 60 mL/min — AB (ref >90)
GFR, EST NON AFRICAN AMERICAN: 52 mL/min — AB (ref >90)
Glucose, Bld: 97 mg/dL (ref 70–99)
Potassium: 4.4 mEq/L (ref 3.7–5.3)
SODIUM: 142 meq/L (ref 137–147)

## 2013-12-22 LAB — GLUCOSE, CAPILLARY
Glucose-Capillary: 98 mg/dL (ref 70–99)
Glucose-Capillary: 135 mg/dL — ABNORMAL HIGH (ref 70–99)

## 2013-12-22 MED ORDER — AMOXICILLIN-POT CLAVULANATE 875-125 MG PO TABS: 1.0000 | ORAL_TABLET | Freq: Two times a day (BID) | ORAL | Status: DC

## 2013-12-22 NOTE — Progress Notes (Signed)
Discharge orders received. Pt for discharge home today. IV d/c'd. Pt given discharge instructions and prescriptions with verbalized understanding. Family in room to assist with discharge. Staff brought pt downstairs via wheelchair.   

## 2013-12-22 NOTE — Progress Notes (Signed)
CARE MANAGEMENT NOTE 12/22/2013  Patient:  Halford ChessmanAYLOR,Evamae E   Account Number:  [id number]  Date Initiated:  12/22/2013  Documentation initiated by:  [facility] ParkHAVIS,Kameren Pargas  Subjective/Objective Assessment:   Acute encephalopathy     Action/Plan:   Anticipated DC Date:  12/22/2013   Anticipated DC Plan:  HOME W HOME HEALTH SERVICES      DC Planning Services  CM consult      [facility]   Choice offered to / List presented to:  C-1 Patient   DME arranged  3-N-1      DME agency  [facility]     HH arranged  HH-2 PT  HH-3 OT  HH-1 RN      [facility]   Status of service:  Completed, signed off Medicare Important Message given?  YES (If response is "NO", the following Medicare IM given date fields will be blank) Date Medicare IM given:  12/21/2013 Medicare IM given by:  [facility] Rehabilitation[facility]AVIS,Madina Galati Date Additional Medicare IM given:   Additional Medicare IM given by:    Discharge Disposition:  HOME W HOME HEALTH SERVICES  Per UR Regulation:  Reviewed for med. necessity/level of care/duration of stay  If discussed at Long Length of Stay Meetings, dates discussed:    Comments:  12/22/2013 1700 NCM contacted [facility] to make aware pt was dc home. [facility].  Isidoro DonningAlesia Bera Pinela RN CCM Case Mgmt phone [phone]  12/21/2013 11:32 AM NCM spoke to pt and states she lives at home with dtr. States she has RW at home. Requesting 3n1 for home. Pt states she had HH in the past. Pt did not have [facility]. Will verify with [facility] Isidoro DonningAlesia Cordelro Gautreau RN CCM Case Mgmt phone [phone]   Medicare Important Message given? YES   (If response is "NO", the following Medicare IM given date fields will be blank)   Date Medicare IM given:  12/21/2013 Medicare IM given by: Daleen BoSHAVIS, Ireanna Finlayson Taris Galindo RN CCM Case Mgmt phone [phone]

## 2013-12-22 NOTE — Plan of Care (Signed)
Problem: Phase II Progression Outcomes Goal: Discharge plan established Outcome: Completed/Met Date Met:  12/22/13  Problem: Phase III Progression Outcomes Goal: Pain controlled on oral analgesia Outcome: Completed/Met Date Met:  12/22/13     

## 2013-12-22 NOTE — Discharge Instructions (Signed)

## 2013-12-22 NOTE — Discharge Summary (Signed)
[facility]  Physician Discharge Summary   Patient ID: Kirsten Young MRN: [id number] DOB/AGE: [birth year] 53 y.o.  Admit date: 12/18/2013 Discharge date: 12/22/2013  PCP: EASON,ERNEST, MD  DISCHARGE DIAGNOSES:  Principal Problem:   Acute encephalopathy Active Problems:   Hypothyroidism   Hypertension   Diabetes mellitus without complication   Depression   Fever   Acute sinusitis   RECOMMENDATIONS FOR OUTPATIENT FOLLOW UP: 1. Home health PT, OT and RN 2. Seroquel has been discontinued.   DISCHARGE CONDITION: fair  Diet recommendation: Modified carbohydrate  Filed Weights   12/18/13 2021 12/19/13 0500 12/22/13 0453  Weight: 81.647 kg (180 lb) 118.434 kg (261 lb 1.6 oz) 98.294 kg (216 lb 11.2 oz)    INITIAL HISTORY: This is a 53 year old African-American female with a past medical history of hypertension, diabetes, hypothyroidism, who presented with altered mental status. She was noted to have a temperature of 100.3. Apparently was complaining of flank pain. CT head suggested acute sinusitis. No obvious focal neurological deficits were noted. Patient has visual and hearing impairments at baseline. Apparently, which she was started on Seroquel recently and the dose was increased following which she developed alteration in her mental status.  Consultations:  None  Procedures: 2D ECHO Study Conclusions - Left ventricle: The cavity size was normal. Systolic function wasnormal. The estimated ejection fraction was in the range of 55%to 60%. Although no diagnostic regional wall motion abnormalitywas identified, this possibility cannot be completely excluded onthe basis of this study. - Pulmonary arteries: Systolic pressure was moderately increased.PA peak pressure: 50 mm Hg (S).  LE Venous Doppler No DVT  HOSPITAL COURSE:   Acute encephalopathy Patient's mental status gradually improved. CT head did not show any acute findings except for the sinusitis. Spoke  to her daughter today and she tells me that her mother's mental status is back to baseline. Reason for her encephalopathy was possibly a combination of infection and medication (seroquel). It did not appear to be CNS infection. Apparently the patient's Seroquel was started in the last 1-2 months and the dose was significantly increased recently. And her mental status has been altered ever since. We have stopped her Seroquel. And she should stay off it.  Acute Sinusitis She was initially started on broad-spectrum antibiotics. Her WBC was 20. WBC came down to normal. She was changed over to oral Augmentin. She will continue the course for 5 more days.   History of Hypothyroidism TSH was noted to be low. Free T4 is normal. Continue Synthroid at current dose.   History of DM-II HBA1c 6.3. May resume home medications  Essential hypertension  On Lasix and atenolol at home. May resume home medications. She was noted to have lower extremity edema. No DVT was noted. Echocardiogram report as above. Pulmonary hypertension noted which can be pursued as OP.  History of Depression Continue Celexa. We are discontinuing her Seroquel as discussed above.  Patient has significantly improved. She was seen by physical therapy and there was some concern about availability of supervision. SNF was thought to be one option. I have discussed with the patient and with the daughter. The patient will prefer to go home. Discussed with the daughter and she agrees to take her home. So home health will be arranged. She is stable for discharge.  PERTINENT LABS:  The results of significant diagnostics from this hospitalization (including imaging, microbiology, ancillary and laboratory) are listed below for reference.    Microbiology: Recent Results (from the past 240 hour(s))  Culture, blood (  routine x 2)     Status: None (Preliminary result)   Collection Time: 12/19/13  2:49 AM  Result Value Ref Range Status   Specimen  Description BLOOD RIGHT ARM  Final   Special Requests BOTTLES DRAWN AEROBIC AND ANAEROBIC 5CC  Final   Culture  Setup Time   Final    12/19/2013 08:15 Performed at [facility]    Culture   Final           BLOOD CULTURE RECEIVED NO GROWTH TO DATE CULTURE WILL BE HELD FOR 5 DAYS BEFORE ISSUING A FINAL NEGATIVE REPORT Performed at [facility]    Report Status PENDING  Incomplete  Culture, blood (routine x 2)     Status: None (Preliminary result)   Collection Time: 12/19/13  4:50 AM  Result Value Ref Range Status   Specimen Description BLOOD RIGHT HAND  Final   Special Requests BOTTLES DRAWN AEROBIC AND ANAEROBIC 3CC EACH  Final   Culture  Setup Time   Final    12/19/2013 08:15 Performed at [facility]    Culture   Final           BLOOD CULTURE RECEIVED NO GROWTH TO DATE CULTURE WILL BE HELD FOR 5 DAYS BEFORE ISSUING A FINAL NEGATIVE REPORT Performed at [facility]    Report Status PENDING  Incomplete     Labs: Basic Metabolic Panel:  Recent Labs Lab 12/18/13 2052 12/19/13 1100 12/20/13 0509 12/21/13 0525 12/22/13 0908  NA 143 144 145 143 142  K 4.4 3.9 4.0 3.7 4.4  CL 101 103 104 103 101  CO2 28 27 24 28 30   GLUCOSE 172* 133* 83 81 97  BUN 20 21 20 18 19   CREATININE 1.25* 1.28* 1.31* 1.30* 1.19*  CALCIUM 9.0 8.6 8.3* 8.2* 9.1   Liver Function Tests:  Recent Labs Lab 12/18/13 2052 12/19/13 1100  AST 16 15  ALT 10 8  ALKPHOS 125* 112  BILITOT 0.3 0.4  PROT 8.6* 8.0  ALBUMIN 3.0* 2.8*   CBC:  Recent Labs Lab 12/18/13 2052 12/19/13 1100 12/20/13 0509 12/21/13 0525 12/22/13 0908  WBC 18.0* 10.0 6.4 5.7 5.0  HGB 11.1* 9.8* 9.4* 9.2* 9.9*  HCT 35.1* 31.7* 30.4* 29.6* 31.7*  MCV 71.2* 70.8* 70.4* 69.8* 70.1*  PLT 413* 324 297 285 346   Cardiac Enzymes:  Recent Labs Lab 12/19/13 0450 12/19/13 1100 12/19/13 1420  TROPONINI <0.30 <0.30 <0.30   CBG:  Recent Labs Lab 12/20/13 1700 12/21/13 0722 12/21/13 2105  12/22/13 0625 12/22/13 1107  GLUCAP 133* 86 126* 98 135*     IMAGING STUDIES Dg Chest 2 View  12/19/2013   CLINICAL DATA:  Acute onset of altered mental status and generalized weakness. Initial encounter.  EXAM: CHEST  2 VIEW  COMPARISON:  Chest radiograph performed 12/06/2013  FINDINGS: The lungs are hypoexpanded. Mild vascular crowding is noted. Mild left basilar atelectasis is seen. There is no evidence of pleural effusion or pneumothorax.  The heart is borderline enlarged. No acute osseous abnormalities are seen.  IMPRESSION: Lungs hypoexpanded, with mild left basilar atelectasis. Borderline cardiomegaly.   Electronically Signed   By: Roanna Raider M.D.   On: 12/19/2013 00:09   Ct Head Wo Contrast  12/18/2013   CLINICAL DATA:  Initial evaluation for acute altered mental status.  EXAM: CT HEAD WITHOUT CONTRAST  TECHNIQUE: Contiguous axial images were obtained from the base of the skull through the vertex without intravenous contrast.  COMPARISON:  Prior study from 12/06/2013  FINDINGS: No acute intracranial hemorrhage or large vessel territory infarct identified. No mass lesion or midline shift. Ventricles are stable in size without evidence of hydrocephalus. No extra-axial fluid collection. Prominent dural and parafalcine calcifications again noted.  Calvarium is markedly thickened but intact. Scattered calcifications within the scalp are stable from prior. No acute scalp soft tissue abnormality. Orbits within normal limits.  Mild mucoperiosteal thickening present within the maxillary sinuses bilaterally, left greater than right. There are associated air-fluid levels, suggesting acute sinus disease. Scattered mucosal thickening present within the ethmoidal air cells bilaterally. Small air-fluid levels noted within the sphenoid sinuses as well. Mastoid air cells are well pneumatized and free of fluid.  IMPRESSION: 1. No acute intracranial process. 2. Scattered opacity with air-fluid levels within the  maxillary and sphenoid sinuses bilaterally, suggesting acute sinus disease. This is slightly worsened relative to recent CT from 12/06/2013.   Electronically Signed   By: Rise MuBenjamin  McClintock M.D.   On: 12/18/2013 23:35    DISCHARGE EXAMINATION: Filed Vitals:   12/22/13 0100 12/22/13 0453 12/22/13 0500 12/22/13 0926  BP: 112/52  111/61 120/41  Pulse: 61  64 60  Temp: 98.6 F (37 C)  98.7 F (37.1 C) 98.1 F (36.7 C)  TempSrc: Oral  Oral Oral  Resp: 16  18 20   Height:      Weight:  98.294 kg (216 lb 11.2 oz)    SpO2: 98%  99% 100%   General appearance: alert, cooperative, appears stated age and no distress. Visually impaired and hard of hearing. Head: Normocephalic, without obvious abnormality, atraumatic Resp: clear to auscultation bilaterally Cardio: regular rate and rhythm, S1, S2 normal, no murmur, click, rub or gallop GI: soft, non-tender; bowel sounds normal; no masses,  no organomegaly   DISPOSITION: Home with daughter. Home health.  Discharge Instructions    Call MD for:  difficulty breathing, headache or visual disturbances    Complete by:  As directed      Call MD for:  extreme fatigue    Complete by:  As directed      Call MD for:  persistant dizziness or light-headedness    Complete by:  As directed      Call MD for:  persistant nausea and vomiting    Complete by:  As directed      Call MD for:  temperature >100.4    Complete by:  As directed      Diet Carb Modified    Complete by:  As directed      Discharge instructions    Complete by:  As directed   Please stop taking Seroquel as instructed. Please follow up with your PCP next week.     Increase activity slowly    Complete by:  As directed            ALLERGIES: No Known Allergies  Current Discharge Medication List    START taking these medications   Details  amoxicillin-clavulanate (AUGMENTIN) 875-125 MG per tablet Take 1 tablet by mouth every 12 (twelve) hours. For 5 more days Qty: 10 tablet,  Refills: 0      CONTINUE these medications which have NOT CHANGED   Details  atenolol (TENORMIN) 50 MG tablet Take 50 mg by mouth daily.    citalopram (CELEXA) 20 MG tablet Take 20 mg by mouth daily.    furosemide (LASIX) 40 MG tablet Take 40 mg by mouth daily.    glimepiride (AMARYL) 4 MG tablet Take 4  mg by mouth daily with breakfast.    levothyroxine (SYNTHROID, LEVOTHROID) 125 MCG tablet Take 125 mcg by mouth daily before breakfast.    mirtazapine (REMERON) 15 MG tablet Take 15 mg by mouth at bedtime.    Multiple Vitamins-Minerals (MULTIVITAMINS THER. W/MINERALS) TABS tablet Take 1 tablet by mouth daily.    potassium chloride SA (K-DUR,KLOR-CON) 20 MEQ tablet Take 20 mEq by mouth daily.      STOP taking these medications     QUEtiapine (SEROQUEL) 50 MG tablet        Follow-up Information    Follow up with EASON,ERNEST, MD. Schedule an appointment as soon as possible for a visit in 1 week.   Specialty:  Internal Medicine   Why:  post hospitalization follow up   Contact information:   [address] [place] [address] [phone]       TOTAL DISCHARGE TIME: 35 mins  [facility]  [facility] Pager [phone]  12/22/2013, 12:47 PM

## 2013-12-24 LAB — GLUCOSE, CAPILLARY
Glucose-Capillary: 129 mg/dL — ABNORMAL HIGH (ref 70–99)
Glucose-Capillary: 158 mg/dL — ABNORMAL HIGH (ref 70–99)

## 2013-12-25 LAB — CULTURE, BLOOD (ROUTINE X 2)
CULTURE: NO GROWTH
Culture: NO GROWTH

## 2014-03-11 ENCOUNTER — Emergency Department (HOSPITAL_COMMUNITY): Payer: Medicare Other

## 2014-03-11 ENCOUNTER — Encounter (HOSPITAL_COMMUNITY): Payer: Self-pay | Admitting: *Deleted

## 2014-03-11 ENCOUNTER — Emergency Department (HOSPITAL_COMMUNITY)
Admission: EM | Admit: 2014-03-11 | Discharge: 2014-03-15 | Disposition: A | Discharge status: Home / Self Care | Payer: Medicare Other | Attending: Emergency Medicine | Admitting: Emergency Medicine

## 2014-03-11 ENCOUNTER — Ambulatory Visit: Payer: Self-pay | Admitting: Vascular Surgery

## 2014-03-11 DIAGNOSIS — R079 Chest pain, unspecified: Secondary | ICD-10-CM | POA: Diagnosis present

## 2014-03-11 DIAGNOSIS — F333 Major depressive disorder, recurrent, severe with psychotic symptoms: Secondary | ICD-10-CM | POA: Diagnosis present

## 2014-03-11 DIAGNOSIS — Z8742 Personal history of other diseases of the female genital tract: Secondary | ICD-10-CM | POA: Insufficient documentation

## 2014-03-11 DIAGNOSIS — I10 Essential (primary) hypertension: Secondary | ICD-10-CM | POA: Diagnosis not present

## 2014-03-11 DIAGNOSIS — Z79899 Other long term (current) drug therapy: Secondary | ICD-10-CM | POA: Insufficient documentation

## 2014-03-11 DIAGNOSIS — E079 Disorder of thyroid, unspecified: Secondary | ICD-10-CM | POA: Diagnosis not present

## 2014-03-11 DIAGNOSIS — H9190 Unspecified hearing loss, unspecified ear: Secondary | ICD-10-CM | POA: Diagnosis not present

## 2014-03-11 DIAGNOSIS — F439 Reaction to severe stress, unspecified: Secondary | ICD-10-CM

## 2014-03-11 DIAGNOSIS — R45851 Suicidal ideations: Secondary | ICD-10-CM | POA: Insufficient documentation

## 2014-03-11 DIAGNOSIS — E119 Type 2 diabetes mellitus without complications: Secondary | ICD-10-CM | POA: Diagnosis not present

## 2014-03-11 DIAGNOSIS — R0602 Shortness of breath: Secondary | ICD-10-CM | POA: Diagnosis not present

## 2014-03-11 HISTORY — DX: Disorder of thyroid, unspecified: E07.9

## 2014-03-11 LAB — BASIC METABOLIC PANEL
ANION GAP: 4 — AB (ref 5–15)
BUN: 22 mg/dL (ref 6–23)
CO2: 26 mmol/L (ref 19–32)
Calcium: 8.9 mg/dL (ref 8.4–10.5)
Chloride: 111 mmol/L (ref 96–112)
Creatinine, Ser: 1.11 mg/dL — ABNORMAL HIGH (ref 0.50–1.10)
GFR calc Af Amer: 65 mL/min — ABNORMAL LOW (ref >90)
GFR calc non Af Amer: 56 mL/min — ABNORMAL LOW (ref >90)
Glucose, Bld: 117 mg/dL — ABNORMAL HIGH (ref 70–99)
Potassium: 4.3 mmol/L (ref 3.5–5.1)
Sodium: 141 mmol/L (ref 135–145)

## 2014-03-11 LAB — CBC
HEMATOCRIT: 36 % (ref 36.0–46.0)
Hemoglobin: 11.1 g/dL — ABNORMAL LOW (ref 12.0–15.0)
MCH: 21.7 pg — ABNORMAL LOW (ref 26.0–34.0)
MCHC: 30.8 g/dL (ref 30.0–36.0)
MCV: 70.3 fL — ABNORMAL LOW (ref 78.0–100.0)
Platelets: 260 10*3/uL (ref 150–400)
RBC: 5.12 MIL/uL — ABNORMAL HIGH (ref 3.87–5.11)
RDW: 16.9 % — ABNORMAL HIGH (ref 11.5–15.5)
WBC: 7.5 10*3/uL (ref 4.0–10.5)

## 2014-03-11 LAB — URINALYSIS, ROUTINE W REFLEX MICROSCOPIC
BILIRUBIN URINE: NEGATIVE
Glucose, UA: NEGATIVE mg/dL
Hgb urine dipstick: NEGATIVE
Ketones, ur: NEGATIVE mg/dL
Nitrite: NEGATIVE
PH: 5 (ref 5.0–8.0)
PROTEIN: NEGATIVE mg/dL
SPECIFIC GRAVITY, URINE: 1.037 — AB (ref 1.005–1.030)
UROBILINOGEN UA: 1 mg/dL (ref 0.0–1.0)

## 2014-03-11 LAB — CBG MONITORING, ED: Glucose-Capillary: 106 mg/dL — ABNORMAL HIGH (ref 70–99)

## 2014-03-11 LAB — URINE MICROSCOPIC-ADD ON

## 2014-03-11 LAB — I-STAT TROPONIN, ED: Troponin i, poc: 0.03 ng/mL (ref 0.00–0.08)

## 2014-03-11 NOTE — ED Notes (Signed)
Patient speaking with Philippa ChesterBrittney, Child psychotherapistsocial worker.

## 2014-03-11 NOTE — Progress Notes (Signed)
EDCM spoke to EDPA  regarding consult. Patient lives at home with her daughter and son in law (who she may not get along with).  Per EDPA, patient requesting to stay overnight and will speak to social services in the AM.  Patient cleared medically per EDPA.  [facility].  No further EDCM needs at this time.

## 2014-03-11 NOTE — ED Notes (Signed)
Bed: WA17 Expected date:  Expected time:  Means of arrival:  Comments: Triage 3 

## 2014-03-11 NOTE — ED Provider Notes (Signed)
CSN: [id number]     Arrival date & time 03/11/14  1856 History   First MD Initiated Contact with Patient 03/11/14 2137     Chief Complaint  Patient presents with  . Chest Pain  . Eye Problem   (Consider location/radiation/quality/duration/timing/severity/associated sxs/prior Treatment) HPI Kirsten Young is a 54 yo female presenting with report of chest pain and shortness of breath.  She states when she came back from a doctor's appointment this morning, she began arguing with her son-in-law.  She states he is always mean to her but today it escalated and he called her a "fat bitch" and she became very upset.  During the argument she felt like her chest was hurting.  She states her chest has improved now and she would like to stay the night in the ED because she does not want to go home because she feels like she will get into another argument with him.  The nursing staff report that [facility] states the family mentioned a concern for a UTI as the reason she became so upset today. The pt states she does not feel like she has a UTI, she only became so upset because of how her son-in-law treats her and the name he called her today.  There was a mention of visual changes but pt states this not acute and is an ongoing problem and she believes it is related to her diabetes. She denies any fevers, chills, nausea, vomiting, or abd pain     Past Medical History  Diagnosis Date  . Renal disorder   . Hypertension   . Diabetes mellitus without complication   . Thyroid disease    Past Surgical History  Procedure Laterality Date  . Foot surgery     No family history on file. History  Substance Use Topics  . Smoking status: Never Smoker   . Smokeless tobacco: Not on file  . Alcohol Use: Yes     Comment: occasionally   OB History    No data available     Review of Systems  Constitutional: Negative for fever and chills.  HENT: Negative for sore throat.   Eyes: Negative for visual disturbance.   Respiratory: Negative for cough and shortness of breath.   Cardiovascular: Positive for chest pain. Negative for leg swelling.  Gastrointestinal: Negative for nausea, vomiting and diarrhea.  Genitourinary: Negative for dysuria.  Musculoskeletal: Negative for myalgias.  Skin: Negative for rash.  Neurological: Negative for weakness, numbness and headaches.  Psychiatric/Behavioral: The patient is nervous/anxious.     Allergies  Review of patient's allergies indicates no known allergies.  Home Medications   Prior to Admission medications   Medication Sig Start Date End Date Taking? Authorizing Provider  acetaminophen (TYLENOL) 325 MG tablet Take 650 mg by mouth every 6 (six) hours as needed for moderate pain or headache.   Yes Historical Provider, MD  atenolol (TENORMIN) 50 MG tablet Take 50 mg by mouth daily.   Yes Historical Provider, MD  glimepiride (AMARYL) 4 MG tablet Take 4 mg by mouth daily with breakfast.   Yes Historical Provider, MD  levothyroxine (SYNTHROID, LEVOTHROID) 125 MCG tablet Take 125 mcg by mouth daily before breakfast.   Yes Historical Provider, MD  lisinopril (PRINIVIL,ZESTRIL) 5 MG tablet Take 5 mg by mouth daily.   Yes Historical Provider, MD  metFORMIN (GLUCOPHAGE) 500 MG tablet Take by mouth 2 (two) times daily with a meal.   Yes Historical Provider, MD  potassium chloride SA (K-DUR,KLOR-CON) 20 MEQ  tablet Take 20 mEq by mouth daily.   Yes Historical Provider, MD  amoxicillin-clavulanate (AUGMENTIN) 875-125 MG per tablet Take 1 tablet by mouth every 12 (twelve) hours. For 5 more days Patient not taking: Reported on 03/11/2014 12/22/13   Osvaldo Shipper, MD  furosemide (LASIX) 40 MG tablet Take 40 mg by mouth daily.    Historical Provider, MD   BP 123/68 mmHg  Pulse 73  Temp(Src) 97.7 F (36.5 C) (Oral)  Resp 19  SpO2 100% Physical Exam  Constitutional: She appears well-developed and well-nourished. No distress.  HENT:  Head: Normocephalic and atraumatic.   Mouth/Throat: Oropharynx is clear and moist. No oropharyngeal exudate.  Very hard of hearing  Eyes: Conjunctivae are normal.  Neck: Neck supple. No thyromegaly present.  Cardiovascular: Normal rate, regular rhythm and intact distal pulses.   Pulmonary/Chest: Effort normal and breath sounds normal. No respiratory distress. She has no wheezes. She has no rales. She exhibits no tenderness.  Abdominal: Soft. There is no tenderness.  Musculoskeletal: She exhibits no tenderness.  Lymphadenopathy:    She has no cervical adenopathy.  Neurological: She is alert.  Skin: Skin is warm and dry. No rash noted. She is not diaphoretic.  Psychiatric: She has a normal mood and affect.  Nursing note and vitals reviewed.   ED Course  Procedures (including critical care time) Labs Review Labs Reviewed  URINALYSIS, ROUTINE W REFLEX MICROSCOPIC - Abnormal; Notable for the following:    Specific Gravity, Urine 1.037 (*)    Leukocytes, UA TRACE (*)    All other components within normal limits  URINE MICROSCOPIC-ADD ON - Abnormal; Notable for the following:    Squamous Epithelial / LPF FEW (*)    Bacteria, UA FEW (*)    Casts GRANULAR CAST (*)    All other components within normal limits  CBC  BASIC METABOLIC PANEL  [name], ED    Imaging Review Dg Chest 2 View  03/11/2014   CLINICAL DATA:  Weakness.  Confusion.  Mental status changes.  EXAM: CHEST  2 VIEW  COMPARISON:  12/18/2013  FINDINGS: Atherosclerotic aortic arch. Heart size within normal limits. The lungs appear clear.  No pleural effusion identified.  Mild thoracic spondylosis.  IMPRESSION: 1. No acute findings. 2. Atherosclerotic aortic arch.   Electronically Signed   By: Gaylyn Rong M.D.   On: 03/11/2014 20:41     EKG Interpretation None      MDM   Final diagnoses:  Chest pain due to psychological stress  Suicidal ideation   54 yo presents from [facility] with report of chest pain after argument with son-in-law.  Pt  reports symptoms have resolved but would like to stay in the ED until the morning so she can find a new place to live.  PTAR reports family states she has been more aggressive lately and want a psych eval and for her to be checked for UTI.  Her labs are without significant abnormality and she states she does det angry with her son-in-law but it is only because of how mean he is to her. Social work consulted for recommendations of social issues.  Discussed too late to go to shelter.    12:50 AM: During eval pt reports that she feels it is sad that her daughter doesn't care about her more and reports she feels like it would be best is she was not in the world anymore.  She states she feels like it would best for everyone if she jumped out a  car while it was driving. When asked if she truly felt that way, she replied "yes, I'm not crazy, but I feel like when my brother picks me up in the morning, I will jumped out of his truck while it's going, so nobody has to worry about me anymore and let the good Lord take care of it."   Patient has been medically cleared in the ED and is awaiting consult by TTS team for possible placement. Pt is currently having SI  But not and appears stable in NAD. [facility]     Filed Vitals:   03/11/14 1859  BP: 123/68  Pulse: 73  Temp: 97.7 F (36.5 C)  TempSrc: Oral  Resp: 19  SpO2: 100%   Meds given in ED:  Medications - No data to display  New Prescriptions   No medications on file       Harle BattiestElizabeth Javiana Anwar, NP 03/12/14 1329  Harrold DonathNathan R. Rubin PayorPickering, MD 03/12/14 1453

## 2014-03-11 NOTE — ED Notes (Signed)
Pt reports her daughter was the one who wanted her to be transported to the ED.  Pt reports CP and visual changes.  Pt reports hx of DM.  Pt is A&O x 4.  Pt is calm and cooperative at this time.

## 2014-03-11 NOTE — ED Notes (Addendum)
Social worker reports that patient does not want to go back to her daughter's home.  She has been made aware that homeless shelters are not accepting any additional guests at this time.

## 2014-03-11 NOTE — ED Notes (Signed)
Per EMS, pt from home, pt's family reports pt was at a doctor's office to have an angiogram done and became combative towards her family.  Pt is HOH.  Pt's family wants pt to be evaluated for possible UTI and for "acting crazy."

## 2014-03-11 NOTE — Progress Notes (Signed)
CSW met with pt at bedside. Patient states that she presents to [facility] today due to chest pain. Pt states that her son in law called her a "B*t**" today while having a disagreement about having the lights on in the house. Pt informed CSW that she does pay rent to stay in the home. Per pt, she lives with her daughter and son in [employer].   Pt was informed that there are no other shelters available tonight. Pt cannot ambulate without her walker. Pt states that her walker is at her daughter's house where she has been living. Pt is refusing shelter. Pt states that she does not want to go back to her daughters home because she is still upset about the interaction with her son in law today even thought her daughter will let her come back. Pt states that all of her belongings are at her daughters house.   CSW reached out to Tomika/Daughter with pt's permission to confirm if she would be allowed in the home. However, she did not answer the phone. CSW sent non-emergency police to pt's listed address to do a safety check. Pt stated that she is unsure if this her daughter's address or not.  Pt also states that she owns a trailer and can go there to stay. However, CSW questioned the pt if she had electricity or water. The pt states that she does not have either at this time but plans to get the electric and water turned on.    Willette Brace [phone] ED CSW 03/12/2014 12:25 AM

## 2014-03-11 NOTE — ED Notes (Signed)
Bed: [facility]3 Expected date:  Expected time:  Means of arrival:  Comments: EMS 

## 2014-03-11 NOTE — ED Notes (Signed)
unsuccessful attempt to draw blood 2X, notified other staff members about attempt

## 2014-03-11 NOTE — ED Notes (Signed)
Pt reports she and her son-in-law got in an argument because she left the light on.  She states that he called her a "B. I. T. C. H." and she got mad and left the house.

## 2014-03-12 DIAGNOSIS — F333 Major depressive disorder, recurrent, severe with psychotic symptoms: Secondary | ICD-10-CM | POA: Diagnosis present

## 2014-03-12 DIAGNOSIS — R45850 Homicidal ideations: Secondary | ICD-10-CM | POA: Diagnosis not present

## 2014-03-12 DIAGNOSIS — R079 Chest pain, unspecified: Secondary | ICD-10-CM | POA: Diagnosis not present

## 2014-03-12 DIAGNOSIS — F439 Reaction to severe stress, unspecified: Secondary | ICD-10-CM | POA: Insufficient documentation

## 2014-03-12 LAB — CBG MONITORING, ED
GLUCOSE-CAPILLARY: 90 mg/dL (ref 70–99)
Glucose-Capillary: 95 mg/dL (ref 70–99)

## 2014-03-12 MED ORDER — ONDANSETRON HCL 4 MG PO TABS: 4.0000 mg | ORAL_TABLET | Freq: Three times a day (TID) | ORAL | Status: DC | PRN

## 2014-03-12 MED ORDER — ACETAMINOPHEN 325 MG PO TABS: 650.0000 mg | ORAL_TABLET | ORAL | Status: DC | PRN

## 2014-03-12 MED ORDER — METFORMIN HCL 500 MG PO TABS
500.0000 mg | ORAL_TABLET | Freq: Two times a day (BID) | ORAL | Status: DC
  Administered 2014-03-12 – 2014-03-15 (×5): 500 mg via ORAL
  Filled 2014-03-12 (×11): qty 1

## 2014-03-12 MED ORDER — GLIMEPIRIDE 4 MG PO TABS
4.0000 mg | ORAL_TABLET | Freq: Every day | ORAL | Status: DC
  Administered 2014-03-12 – 2014-03-15 (×3): 4 mg via ORAL
  Filled 2014-03-12 (×5): qty 1

## 2014-03-12 MED ORDER — LEVOTHYROXINE SODIUM 125 MCG PO TABS
125.0000 ug | ORAL_TABLET | Freq: Every day | ORAL | Status: DC
  Administered 2014-03-12 – 2014-03-15 (×4): 125 ug via ORAL
  Filled 2014-03-12 (×5): qty 1

## 2014-03-12 MED ORDER — LISINOPRIL 5 MG PO TABS
5.0000 mg | ORAL_TABLET | Freq: Every day | ORAL | Status: DC
  Administered 2014-03-12 – 2014-03-14 (×3): 5 mg via ORAL
  Filled 2014-03-12 (×5): qty 1

## 2014-03-12 MED ORDER — RISPERIDONE 1 MG PO TBDP
1.0000 mg | ORAL_TABLET | Freq: Every day | ORAL | Status: DC
  Administered 2014-03-12 – 2014-03-14 (×3): 1 mg via ORAL
  Filled 2014-03-12 (×4): qty 1

## 2014-03-12 MED ORDER — POTASSIUM CHLORIDE CRYS ER 20 MEQ PO TBCR
20.0000 meq | EXTENDED_RELEASE_TABLET | Freq: Every day | ORAL | Status: DC
  Administered 2014-03-12 – 2014-03-15 (×4): 20 meq via ORAL
  Filled 2014-03-12 (×4): qty 1

## 2014-03-12 MED ORDER — ATENOLOL 50 MG PO TABS
50.0000 mg | ORAL_TABLET | Freq: Every day | ORAL | Status: DC
  Administered 2014-03-12 – 2014-03-14 (×2): 50 mg via ORAL
  Filled 2014-03-12 (×5): qty 1

## 2014-03-12 MED ORDER — FUROSEMIDE 40 MG PO TABS
40.0000 mg | ORAL_TABLET | Freq: Every day | ORAL | Status: DC
  Administered 2014-03-12 – 2014-03-14 (×3): 40 mg via ORAL
  Filled 2014-03-12 (×4): qty 1
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
## 2014-03-12 NOTE — BHH Counselor (Signed)
TTS Counselor spoke with pt's attending RN, Lonell FaceBrandy Harris, in order to gain collateral information. Counselor also reviewed pt's chart and LCSW notes [facility].  Assessment to begin shortly.   Cyndie MullAnna Devony Mcgrady, [facility] Triage Specialist
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
## 2014-03-12 NOTE — ED Notes (Signed)
MD at bedside. 

## 2014-03-12 NOTE — BH Assessment (Signed)
Collateral Information Kirsten Young(Kirsten Young) [phone]:   Patient living in a apartment 6 months ago, alone, and completely independent. Patient was found in apartment not able to care for self and dehydrated. Patient was placed in the hospital for stabilization and family told that she could no longer stay alone as she was not able to care for self appropriately.   Patient discharged from the hospital after medical stabilization to live with daughter, Kirsten Young. Since living with daughter patient increasingly combative and aggression; at night.  Daughter took patient to see a psychiatrist-Dr. Jason Young after 2-3 months of being in her home. Patient was placed on medications at that time. Daughter unable to recall names or dosages of medications. However, recalls that she was placed on a small dose initially and increased. Apparently the medications prompted patient to be confused, irritated, and combative.  Sts that she initially thought behavior was related to night terrors, night mares, etc as her behaviors were only present at night. Sts that she has witnessed patient on many occasions having sex in her sleep, as she describes the sexual act by "sleep talking".   Daughter has noticed symptoms present during the day. Onset was 2 weeks ago. Patient also not sleeping. Daughter reports a 45 pounds weight loss in the past 2 months but no appetite changes.   Patient has also decompensated so much so that she has needed a walker.   Daughter sts that patient does not have a diagnosed psychiatric history. However, patient has symptoms of depression. Sts that patients daughter was killed in a car accident 5-6 yrs ago and since that time patient has declined. Patient has a strong family hx of mental health illness. Daughter sts, "All her siblings are mentally ill".   Patient has not specifically mentioned suicidally recently. However, in the recent past has stated, "I wish I was just dead..I would be better off. No  mention of homicidally and/or AVH's to family. No hx of alcohol of drug use.   Patient's (2) daughters including, Kirsten Young and Kirsten Young are both Health Care Power of [place]. Daughter(s) plans to bring appropriate legal paperwork indicating that she is Health Care Power of [place]Attorney today. Daughter, Kirsten Young has also asked that [place] remain in [place] or [place]. Daughter sts, "Our family will have a hard time traveling and it's just easier if she is hospitalized locally. Patient's made aware that those wishes would be considered, however; if placement is found else where she may be transferred to that facility for continuity of care.

## 2014-03-12 NOTE — ED Notes (Signed)
Tobi BastosAnna, Research scientist (medical)TTS worker, at bedside.
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
## 2014-03-12 NOTE — Progress Notes (Addendum)
CSW faxed patient referral to the following facilities with bed/potential bed available tonight: [facility], [facility], [facility](refaxed), [facility], [facility], [facility], [facility], and [facility].  [place] Abtsatia Artis Beggs, LCSWA Disposition staff 03/12/2014 9:47 PM

## 2014-03-12 NOTE — ED Notes (Signed)
Patient's belongings sorted through by two staff members.  Some valuables locked in box #5 with security.

## 2014-03-12 NOTE — BHH Counselor (Signed)
Per Donell SievertSpencer Simon, PA, pt meets inpatient treatment criteria. Gero-psych is recommended due to pt's mental health, medical, and ADL needs.  TTS Counselor informed attending physician (Dr Norlene Campbelltter) and attending RN Gearldine Bienenstock(Brandy) of disposition.   Cyndie MullAnna Berlie Hatchel, [facility] Triage Specialist

## 2014-03-12 NOTE — ED Notes (Signed)
Pt a/ox3, pt sts that she will not return to her daughters house; sts that she will jump out of a moving car is someone makes her return to her daughters house
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
## 2014-03-12 NOTE — ED Notes (Signed)
Kirsten Young [phone]

## 2014-03-13 ENCOUNTER — Encounter (HOSPITAL_COMMUNITY): Payer: Self-pay | Admitting: Registered Nurse

## 2014-03-13 DIAGNOSIS — R079 Chest pain, unspecified: Secondary | ICD-10-CM | POA: Diagnosis not present

## 2014-03-13 DIAGNOSIS — F333 Major depressive disorder, recurrent, severe with psychotic symptoms: Secondary | ICD-10-CM | POA: Diagnosis not present

## 2014-03-13 DIAGNOSIS — R45850 Homicidal ideations: Secondary | ICD-10-CM

## 2014-03-13 LAB — CBG MONITORING, ED
GLUCOSE-CAPILLARY: 58 mg/dL — AB (ref 70–99)
Glucose-Capillary: 85 mg/dL (ref 70–99)
Glucose-Capillary: 90 mg/dL (ref 70–99)
Glucose-Capillary: 58 mg/dL — ABNORMAL LOW (ref 70–99)
Glucose-Capillary: 101 mg/dL — ABNORMAL HIGH (ref 70–99)
Glucose-Capillary: 69 mg/dL — ABNORMAL LOW (ref 70–99)

## 2014-03-13 NOTE — ED Notes (Signed)
Pt given orange juice for CBG of 69

## 2014-03-13 NOTE — ED Notes (Signed)
Sitter assisting pt with shower.
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
## 2014-03-13 NOTE — ED Notes (Signed)
Pt CBG 58. Pt given orange juice and graham crackers. Lunch en route.

## 2014-03-13 NOTE — ED Notes (Signed)
Pt given orange juice and graham crackers for CBG of 58.
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
## 2014-03-14 DIAGNOSIS — R45850 Homicidal ideations: Secondary | ICD-10-CM | POA: Diagnosis not present

## 2014-03-14 DIAGNOSIS — R079 Chest pain, unspecified: Secondary | ICD-10-CM | POA: Diagnosis not present

## 2014-03-14 LAB — CBG MONITORING, ED
GLUCOSE-CAPILLARY: 105 mg/dL — AB (ref 70–99)
Glucose-Capillary: 95 mg/dL (ref 70–99)
Glucose-Capillary: 72 mg/dL (ref 70–99)
Glucose-Capillary: 134 mg/dL — ABNORMAL HIGH (ref 70–99)

## 2014-03-14 MED ORDER — RISPERIDONE 1 MG PO TBDP: 1.0000 mg | ORAL_TABLET | Freq: Every day | ORAL | Status: DC

## 2014-03-14 NOTE — ED Notes (Signed)
Patient's daughter called SW and informed that a family member would not be able to pick patient up until 1900 today.
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
## 2014-03-14 NOTE — BH Assessment (Signed)
[facility] Assessment Progress Note  Per Thedore MinsMojeed Akintayo, MD, this pt does not require psychiatric hospitalization.  She would, however, benefit from seeing an outpatient psychiatrist, to which she agrees.  Pt [facility] [place]  She will see Dr Michae KavaAgarwal on Tuesday, April 30, 2014 at 10:00 am.  Since this is her first appointment at the practice, she will need to arrive at 9:30 am.  This information has been included in pt's discharge instructions, and pt's nurse has been asked to review it with her as she is prepared for discharge from the ED.  Doylene Canninghomas Kamauri Kathol, MA Triage Specialist 03/14/2014 @ 17:00

## 2014-03-14 NOTE — ED Notes (Signed)
Patient family has not arrived at this time. Called Tomeka Hatchet [phone]), no answer. Bellefontaine Neighborsalled Venetta [phone]), no answer, message left requesting call back. Spoke to [place], CSW, she confirms she also unable to reach the patient's family members. GrenadaBrittany will continue to reach out to patient family.

## 2014-03-14 NOTE — BHH Suicide Risk Assessment (Cosign Needed)
Suicide Risk Assessment  Discharge Assessment   [facility]   Demographic Factors:  Low socioeconomic status and Unemployed  Total Time spent with patient: 20 minutes  Musculoskeletal: Strength & Muscle Tone: uses waler with assistance Gait & Station: see above Patient leans: uses walker with assistance  Psychiatric Specialty Exam:     Blood pressure 155/112, pulse 61, temperature 98.1 F (36.7 C), temperature source Oral, resp. rate 20, SpO2 100 %.There is no weight on file to calculate BMI.  General Appearance: Casual  Eye Contact::  Good  Speech:  Clear and Coherent and Normal Rate409  Volume:  Normal  Mood:  Depressed  Affect:  Congruent and Depressed  Thought Process:  Coherent  Orientation:  Full (Time, Place, and Person)  Thought Content:  WDL  Suicidal Thoughts:  No  Homicidal Thoughts:  No  Memory:  Immediate;   Fair Recent;   Fair Remote;   Fair  Judgement:  Fair  Insight:  Good and Fair  Psychomotor Activity:  Normal  Concentration:  Good  Recall:  NA  Fund of Knowledge:Fair  Language: Good  Akathisia:  NA  Handed:  Right  AIMS (if indicated):     Assets:  Desire for Improvement  Sleep:     Cognition: WNL  ADL's:  Impaired      Has this patient used any form of tobacco in the last 30 days? (Cigarettes, Smokeless Tobacco, Cigars, and/or Pipes) N/A  Mental Status Per Nursing Assessment::   On Admission:     Current Mental Status by Physician: NA  Loss Factors: NA  Historical Factors: NA  Risk Reduction Factors:   Religious beliefs about death and Living with another person, especially a relative  Continued Clinical Symptoms:  Depression:   Insomnia  Cognitive Features That Contribute To Risk:  Closed-mindedness and Polarized thinking    Suicide Risk:  Minimal: No identifiable suicidal ideation.  Patients presenting with no risk factors but with morbid ruminations; may be classified as minimal risk based on  the severity of the depressive symptoms  Principal Problem: Major depressive disorder, recurrent episode, severe, with psychotic behavior Discharge Diagnoses:  Patient Active Problem List   Diagnosis Date Noted  . Major depressive disorder, recurrent episode, severe, with psychotic behavior [F33.3] 03/12/2014  . Chest pain due to psychological stress [R07.9, F43.9]   . Altered mental state [R41.82] 12/19/2013  . Hypothyroidism [E03.9] 12/19/2013  . Hypertension [I10] 12/19/2013  . Diabetes mellitus without complication [E11.9] 12/19/2013  . Depression [F32.9] 12/19/2013  . Fever [R50.9] 12/19/2013  . Acute encephalopathy [G93.40] 12/19/2013  . Acute sinusitis [J01.90] 12/19/2013    Follow-up Information    Follow up with Derwood KaplanEason,  Ernest B, MD.   Specialty:  Internal Medicine   Why:  to arrange a follow-up appointment   Contact information:   [address], [address] [place] [address] [phone]       Plan Of Care/Follow-up recommendations:  Activity:  as tolerated Diet:  regular  Is patient on multiple antipsychotic therapies at discharge:  No   Has Patient had three or more failed trials of antipsychotic monotherapy by history:  No  Recommended Plan for Multiple Antipsychotic Therapies: NA    Dahlia ByesONUOHA, Ahad Colarusso, C   PMHNP-BC 03/14/2014, 4:14 PM

## 2014-03-14 NOTE — Progress Notes (Signed)
CSW spoke with [facility]/Daughter who stated that she was running late getting off work and that the pt would be picked up by 7:00pm by her other daughter/Tamika. However, [facility].  CSW reached out to daughter/Kayann and left a voice mail asking for a call back. CSW also reached out to Tamika/daughter both of who did not answer the telephone.  CSW will continue to reach out to family.  Trish MageBrittney Wayburn Shaler, LCSWA [phone] ED CSW 03/14/2014 7:45 PM

## 2014-03-14 NOTE — ED Notes (Signed)
Patient assisted to the bathroom with the NT and a walker. Patient went to the ground. Patient states her knees are weak. No injury noted. Patient lifted back to the bed x 4 people. Charge nurse informed.

## 2014-03-14 NOTE — Progress Notes (Signed)
Per psychiatrist and NP patient psychiatrically stable for discharge home. Pt daughter is coming to pick up patietn after 4pm when she is off work. Pt family interested in resources for assisted living placement. CSW to provide resources when family arrives. Evening CSW to follow up with needs.   Byrd HesselbachKristen Laurie Penado, LCSW [phone]  ED CSW 03/14/2014. 11:17 AM

## 2014-03-14 NOTE — Discharge Instructions (Addendum)
For you ongoing mental health needs, you are advised to see a psychiatrist for outpatient treatment.  You have been scheduled for an appointment with Oletta DarterSalina Agarwal, MD at the [facility] [place].  The appointment is set for Tuesday, April 30, 2014 at 9:30 am.  The office will be mailing intake paperwork to your home.  Please fill it out at your convenience and take it with you to the appointment:       [facility]      [address]      [place] [address]      [phone]

## 2014-03-14 NOTE — Progress Notes (Signed)
CSW reached out to pt's daughters Corrie Dandy(Lilu and Reunionamika). Daughters are not answering the phone. Pt currently lives with her daughter [name]. Daughters have not called back CSW or nurse with any updating information regarding their arrival to pick up the pt.  CSW called the non-emergency police and requested an officer be sent to [address] [place] [address].  Trish MageBrittney Fredrich Cory, LCSWA [phone] ED CSW 03/14/2014 10:04 PM

## 2014-03-14 NOTE — ED Notes (Signed)
Psych Team in with patient. 

## 2014-03-14 NOTE — Progress Notes (Signed)
Officer of [employer] called back and informed CSW that he was able to reach the daughter of pt at her home. Officer states that daughter informed him that she is having transportation issues but will continue to try and come pick up pt.  Also, officer informed CSW that daughter stated that she does not have a phone at this time. However, daughter gave CSW her contact number today.  Trish MageBrittney Dejon Lukas, LCSWA [phone] ED CSW 03/14/2014 10:19 PM

## 2014-03-15 DIAGNOSIS — R079 Chest pain, unspecified: Secondary | ICD-10-CM | POA: Diagnosis not present

## 2014-03-15 LAB — CBG MONITORING, ED: Glucose-Capillary: 99 mg/dL (ref 70–99)

## 2014-03-15 NOTE — ED Notes (Signed)
Belongings returned to pt

## 2014-03-15 NOTE — Progress Notes (Signed)
CSW left message with pt daughter, Mellody LifeMary Woods [phone]. Per chart review, transportation issues last night,  CSW attempted to contact Tomeka Hatchett at [phone] however no answer and unable to leave a message as voicemail has not been set up.   CSW to contact police for safety check if call not received back by 9am.   Olga CoasterKristen Azoria Abbett, LCSW [phone]  ED CSW 03/15/2014 7:21 AM

## 2014-03-15 NOTE — Progress Notes (Signed)
CSW spoke with patient daughter, at work at  [employer] [phone] who shared that she asked if patient could be sent home by cab or ambulance last night however the police informed her no. Patient daughter is calling CSW back with pt brother name and number who is suppose to be helping with patient at daughters home  as patient as issues with pt son in law who is home with patient during the day. Pt daughter states that patient can be discharged home by cab or ems. CSW awaiting return call to set up time for transportation.    Byrd HesselbachKristen Maxie Slovacek, LCSW [phone]  ED CSW 03/15/2014 9:57 AM

## 2014-03-15 NOTE — Progress Notes (Signed)
CSW left another message for patient daughter Mellody LifeMary Woods [phone].   Byrd HesselbachKristen Jusiah Aguayo, LCSW [phone]  ED CSW 03/15/2014 8:28 AM

## 2014-03-15 NOTE — Progress Notes (Signed)
CSW spoke with pt brother Iva BoopWillie Jones [phone]), pt brother as requested by pt daughter Mellody LifeMary Woods. Per pt brother, they had planned to pick up patient from home today and take pt on outing. Pt brother and sister in law [facility] and are [place] their way [facility] [place] [facility]. Per brother, no one in the family had contacted them prior to this conversation and would be following up with pt daughter Mellody LifeMary Woods. CSW informed Mellody LifeMary Woods of the plan and informed her that if patient brother was not here by the afternoon patient would be sent home by ambulance, pt daughter Mellody LifeMary Woods agreed.   Byrd HesselbachKristen Haddie Bruhl, LCSW [phone]  ED CSW 03/15/2014 10:53 AM

## 2014-03-15 NOTE — Progress Notes (Signed)
CSW has continued to call patient family for pt discharge with no answer.   Byrd HesselbachKristen Miria Cappelli, LCSW [phone]  ED CSW 03/15/2014 9:40 AM

## 2014-04-30 ENCOUNTER — Ambulatory Visit (HOSPITAL_COMMUNITY): Payer: Medicare Other | Admitting: Psychiatry

## 2014-04-30 ENCOUNTER — Telehealth (HOSPITAL_COMMUNITY): Payer: Self-pay | Admitting: *Deleted

## 2014-04-30 NOTE — Telephone Encounter (Signed)
Patient no showed appointment today with Dr. Michae KavaAgarwal.  Patient is a new patient to Dr. Michae KavaAgarwal.  Appointment was scheduled with [facility] social worker.  I tried to contact patient, could not leave a message because voice mail was full.  Will send patient a letter.

## 2014-05-10 NOTE — Op Note (Signed)
PATIENT NAME:  Kirsten ChessmanAYLOR, Carlotta E MR#:  [id number] DATE OF BIRTH:  1960/11/28  DATE OF PROCEDURE:  05/01/2012  PREOPERATIVE DIAGNOSES:  1.  Ulcerations, left lower extremity.  2.  Noninvasive study showing monophasic flow in the lower extremity worrisome for iliac disease.  3.  Diabetes.  4.  Hypertension.   POSTOPERATIVE DIAGNOSES:  1.  Ulcerations, left lower extremity.  2.  Noninvasive study showing monophasic flow in the lower extremity worrisome for iliac disease.  3.  Diabetes.  4.  Hypertension.   PROCEDURES:  1.  Ultrasound guidance for vascular access, right femoral artery.  2.  Catheter placement to left common femoral artery from a right femoral approach.  3.  Aortogram and selective left lower extremity angiogram.  4.  StarClose closure device, right femoral artery.   SURGEON: Annice NeedyJason S Dew, M.D.   ANESTHESIA: Local with moderate conscious sedation.   ESTIMATED BLOOD LOSS: Minimal.   FLUOROSCOPY TIME: Approximately 2 minutes.  CONTRAST: 55 mL Visipaque.   INDICATION FOR PROCEDURE: This is a female with ulcerations of her lower extremities. Her noninvasive studies in our office showed monophasic flow from the femoral stem concerning for iliac disease. An angiogram was performed for further evaluation to see if lack of perfusion is contributing to her nonhealing wounds. Risks and benefits were discussed. Informed consent was obtained.   DESCRIPTION OF PROCEDURE: The patient is brought to the vascular and interventional radiology suite. Groins were shaved and prepped and a sterile surgical field was created. The right femoral head was localized with fluoroscopy. Ultrasound was used to visualize a patent right femoral artery that was then accessed under direct ultrasound guidance without difficulty with a Seldinger needle. A J-wire and 5-French sheath were placed. Pigtail catheter was placed in the aorta at the L1-L2 level and AP aortogram was performed. There was then pull down of  the aortic bifurcation and pelvic obliques. The renal arteries were patent bilaterally. The aorta was widely patent without significant stenosis. On pelvic obliques, the iliac arteries were both widely patent without any significant stenosis. I then hooked the aortic bifurcation and advanced to the left femoral heads to perform left lower extremity angiography. This demonstrated a normal common femoral artery, profunda femoris artery and superficial femoral artery. The popliteal artery had minimal stenosis that was not hemodynamically significant. There was a normal tibial trifurcation. The anterior tibial artery was the largest vessel and had the dominant runoff into the foot. The posterior tibial artery was also continuous into the foot. The peroneal artery had flow to the ankle. At this point, with good perfusion, there was no indication for intervention or lesions that needed to be treated. I elected to terminate the procedure. The diagnostic catheter was removed. Oblique arteriogram was performed. StarClose closure device was deployed in the usual fashion with excellent hemostatic result. The patient tolerated the procedure well and was taken to the recovery room in stable condition.   ____________________________ Annice NeedyJason S. Dew, MD jsd:aw D: 05/01/2012 09:51:50 ET T: 05/01/2012 10:02:05 ET JOB#: [id number]  cc: Annice NeedyJason S. Dew, MD, <Dictator> Leanna SatoLinda M. Miles, MD Annice NeedyJASON S DEW MD ELECTRONICALLY SIGNED 05/04/2012 15:26

## 2014-05-10 NOTE — Op Note (Signed)
PATIENT NAME:  Kirsten ChessmanAYLOR, [name] MR#:  [id number] DATE OF BIRTH:  27-Apr-1960  DATE OF PROCEDURE:  05/04/2012  PREOPERATIVE DIAGNOSES: 1.  Nonhealing ulceration with infection and osteomyelitis, left fifth toe.  2.  Lymphedema.  3.  Hypertension.  4.  Diabetes.   POSTOPERATIVE DIAGNOSES:  1.  Nonhealing ulceration with infection and osteomyelitis, left fifth toe.  2.  Lymphedema.  3.  Hypertension.  4.  Diabetes.   PROCEDURE:  Left fifth toe ray amputation.   SURGEON: Annice NeedyJason S. Ronia Hazelett, M.D.   ANESTHESIA: General.   ESTIMATED BLOOD LOSS: Minimal.   INDICATION FOR PROCEDURE: This is a 54 year old [place] female with multiple medical issues. She has developed ulcerations of her feet likely from combination of pressure and volume issues with chronic swelling in her legs with lymphedema. She has previously had an angiogram of the left lower extremity that showed in-line flow with no hemodynamically significant stenosis in the foot and has adequate perfusion for wound healing.  Her left fifth toe has a chronically infected ulcer with an open wound to the bone and she needs to have this amputated. Risks and benefits were discussed. Informed consent was obtained.   DESCRIPTION OF PROCEDURE: The patient is brought to the operative suite and after an adequate level of general anesthesia was obtained, the left lower extremity was sterilely prepped and draped and a sterile surgical field was created. A curvilinear incision was created around the base of the left fifth toe. This was dissected out. There was not a big abscess cavity.  The tissue seemed reasonably healthy, and we dissected down and removed the toe in its entirety. The metatarsal head was then dissected back and the heavy bone cutters were used to take this about a centimeter or 2 further down the foot. There was no un-drained purulence or pus and the tissue appeared healthy and viable with good arterial bleeding. The wound was then closed  with 3 interrupted 2-0 Vicryl sutures, the skin was closed with 5 mattress nylon sutures and a sterile dressing was placed. The patient tolerated the procedure well and was taken to the recovery room in stable condition.  ____________________________ Annice NeedyJason S. Vista Sawatzky, MD jsd:sb D: 05/04/2012 13:37:50 ET T: 05/04/2012 13:58:52 ET JOB#: [id number]  cc: Annice NeedyJason S. Cameryn Chrisley, MD, <Dictator> Annice NeedyJASON S Lenisha Lacap MD ELECTRONICALLY SIGNED 05/04/2012 15:31

## 2014-05-11 NOTE — Consult Note (Signed)
General Aspect lymphedema associated with left foot ulcers   Present Illness The patient is a 54 year old female with past medical history of Crohn's disease, diabetes, hypertension, lower extremity swelling with lymphedema, infections and amputations, who is a resident of [facility] and following at wound care center for the wound. Yesterday she went to the wound care center for her follow up. For the last 3 weeks, on December 10 at wound care center, they did a culture of her wound, which grew E. coli which was sensitive to Bactrim, so started on Bactrim orally, which she is taking for the last 2 weeks, but noticed increased pain and swelling in the left foot, and so when she visited today, they referred her to the emergency room from wound care center for further management of her wound, as failure of outpatient therapy. They also sent her with a note from wound care center explaining about her previous workup and her wounds.  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   Home Medications: Medication Instructions Status  Aspirin Enteric Coated 81 mg oral delayed release tablet 1 tab(s) orally once a day Active  metFORMIN 500 mg oral tablet 1 tab(s) orally once a day Active  lisinopril 20 mg oral tablet 2 tab(s) orally once a day Active  Lasix 20 mg oral tablet 1 tab(s) orally once a day Active  Cerovite Liquid Multiple Vitamins with Minerals oral liquid 15 milliliter(s) orally once a day Active  Effexor 75 mg oral capsule, extended release 1 cap(s) orally once a day Active  Triple Antibiotic 400 units-3.5 mg-5000 units/g topical ointment Apply topically to right foot wounds once a day Active  Vitamin C 500 mg oral tablet 1 tab(s) orally 2 times a day Active    No Known Allergies:   Case History:  Family History  Non-Contributory   Review of Systems:  Fever/Chills No   Cough No   Sputum No   Abdominal Pain No   Diarrhea No   Constipation No   Nausea/Vomiting No   SOB/DOE No   Chest Pain No   Telemetry Reviewed NSR   Physical Exam:  GEN well developed, well nourished, no acute distress   HEENT red conjunctivae, poor dentition, hard of hearing   NECK supple  trachea midline   RESP normal resp effort  no use of accessory muscles   CARD regular rate  LE edema present   ABD denies tenderness  nondistended   EXTR negative cyanosis/clubbing, positive edema, fixed changes from severe chronic lymphedema; two nickle size ulcers  lateral left foot no drainage;  pedal pulses nonpalpable nontender.   SKIN positive rashes, positive ulcers   NEURO cranial nerves deficit, follows commands, HOH   PSYCH alert, good insight   Routine Micro:  31-Dec-14 17:42   Micro Text Report BLOOD CULTURE   COMMENT                   NO GROWTH IN 8-12 HOURS   ANTIBIOTIC                       Culture Comment NO GROWTH IN 8-12 HOURS  Result(s) reported on 18 Jan 2013 at 03:00AM.    17:43   Micro Text Report BLOOD CULTURE   COMMENT  NO GROWTH IN 8-12 HOURS   ANTIBIOTIC                       Culture Comment NO GROWTH IN 8-12 HOURS  Result(s) reported on 18 Jan 2013 at 03:00AM.  Routine Chem:  31-Dec-14 12:32   Glucose, Serum  111  BUN  45  Creatinine (comp)  1.80  Sodium, Serum  135  Potassium, Serum 4.6  Chloride, Serum 105  CO2, Serum 27  Calcium (Total), Serum 9.1  Anion Gap  3  Osmolality (calc) 282  eGFR (African American)  37  eGFR (Non-African American)  32 (eGFR values <31m/min/1.73 m2 may be an indication of chronic kidney disease (CKD). Calculated eGFR is useful in patients with stable renal function. The eGFR calculation will not be reliable in acutely ill patients when serum creatinine is changing rapidly. It is not useful in  patients on dialysis. The  eGFR calculation may not be applicable to patients at the low and high extremes of body sizes, pregnant women, and vegetarians.)  01-Jan-15 06:27   Glucose, Serum  114  BUN  38  Creatinine (comp)  1.61  Sodium, Serum 138  Potassium, Serum 5.0  Chloride, Serum  108  CO2, Serum 25  Calcium (Total), Serum  8.2  Anion Gap  5  Osmolality (calc) 286  eGFR (African American)  43  eGFR (Non-African American)  37 (eGFR values <611mmin/1.73 m2 may be an indication of chronic kidney disease (CKD). Calculated eGFR is useful in patients with stable renal function. The eGFR calculation will not be reliable in acutely ill patients when serum creatinine is changing rapidly. It is not useful in  patients on dialysis. The eGFR calculation may not be applicable to patients at the low and high extremes of body sizes, pregnant women, and vegetarians.)  Routine Hem:  31-Dec-14 12:32   WBC (CBC) 6.3  RBC (CBC) 4.38  Hemoglobin (CBC)  9.7  Hematocrit (CBC)  30.3  Platelet Count (CBC) 354  MCV  69  MCH  22.2  MCHC 32.1  RDW  16.1  Neutrophil % 70.2  Lymphocyte % 15.3  Monocyte % 9.5  Eosinophil % 4.0  Basophil % 1.0  Neutrophil # 4.4  Lymphocyte # 1.0  Monocyte # 0.6  Eosinophil # 0.3  Basophil # 0.1 (Result(s) reported on 17 Jan 2013 at 12:56PM.)  01-Jan-15 06:27   WBC (CBC) 4.3  RBC (CBC) 3.80  Hemoglobin (CBC)  8.4  Hematocrit (CBC)  26.2  Platelet Count (CBC) 324  MCV  69  MCH  22.0  MCHC  31.9  RDW  16.1  Neutrophil % 68.2  Lymphocyte % 16.1  Monocyte % 10.7  Eosinophil % 4.5  Basophil % 0.5  Neutrophil # 3.0  Lymphocyte #  0.7  Monocyte # 0.5  Eosinophil # 0.2  Basophil # 0.0 (Result(s) reported on 18 Jan 2013 at 07:14AM.)   XRay:    31-Dec-14 18:25, Foot Right AP and Lateral  Foot Right AP and Lateral   REASON FOR EXAM:    wound infection r/o osteomyelitis  COMMENTS:       PROCEDURE: DXR - DXR FOOT RIGHT AP AND LATERAL  - Jan 17 2013  6:25PM     CLINICAL DATA:   Wound infection.  Rule out osteomyelitis.    EXAM:  RIGHT FOOT - 2 VIEW    COMPARISON:  08/21/2010 toe films.    FINDINGS:  Diffuse soft tissue swelling, most marked within the  dorsum of the  midfoot and forefoot. Tibiotalar osteoarthritis. A linear metallic  radiopaque object measures 2.2 cm and is positioned within the  dorsal soft tissues between the 2nd and 3rd metatarsal shafts. No  soft tissue gas is identified.    Presumed postsurgical absence of the 1st digit at the level of the  5th metatarsal shaft. Absence of the 2nd phalanx.    Irregularity of the 5th phalanx is likely degenerative or  posttraumatic. No gross osseous destruction identified.     IMPRESSION:  Diffuse soft tissue swelling with metallic foreign object in the  soft tissues of the dorsal forefoot.    Presumed postsurgical defects about the medial forefoot, without  plain film evidence of acute osteomyelitis.      Electronically Signed    By: Abigail Miyamoto M.D.    On: 01/17/2013 18:30         Verified By: Areta Haber, M.D.,    Impression 1.  Cellulitis. Will give her IV vancomycin, Zosyn and Levaquin. Will call wound care consult for further management of her wound. Will also call Vascular team for assessment of her wounds.  Clinically she has a well perfused foot but given the excessive edema her pulses are not palpable noninvasive studies are in order.  She states she has been to our office for tests.  I will review these studies  2.  Acute renal failure. Will give normal saline and will follow BMP.   3.  Diabetes. Continue metformin for now, and insulin sliding scale coverage.   Plan level 3 consult   Electronic Signatures: Hortencia Pilar (MD)  (Signed 01-Jan-15 11:59)  Authored: General Aspect/Present Illness, Home Medications, Allergies, History and Physical Exam, Labs, Radiology, Impression/Plan   Last Updated: 01-Jan-15 11:59 by Hortencia Pilar (MD)

## 2014-05-11 NOTE — H&P (Signed)
PATIENT NAME:  Kirsten, LOSEE MR#:  [id number] DATE OF BIRTH:  [birth year]  DATE OF ADMISSION:  12/06/2013  REFERRING PHYSICIAN: Kandee Keen R. York Cerise, MD  PRIMARY CARE PHYSICIAN: Serita Sheller. Maryellen Pile, MD  CHIEF COMPLAINT: Altered mental status.   HISTORY OF PRESENT ILLNESS: A 54 year old African-American female with a past medical history of type 2 diabetes, non-insulin-requiring; however, complicated by peripheral vascular disease and nephropathy; hypertension, presenting with altered mental status. Per her daughter, who is currently no longer available for questioning, described altered mental status mainly as lethargy as well as confusion with associated difficulty walking for about 3 days in total. States patient usually uses a walker for ambulation; however, the last 3 days has been unable to do so. This altered mental status started after she was initiated on Seroquel as well as citalopram at the same time. Of note, also had poor p.o. intake over the same period of time. On arrival to the Emergency Room, she was originally with difficulty to arouse; however, she is now improved to baseline.  REVIEW OF SYSTEMS: Difficult to obtain given the patient is extremely hard of hearing. However:  CONSTITUTIONAL: Positive for fatigue, weakness.  EYES: Denies blurred vision, double vision, eye pain.  EARS, NOSE, THROAT: Denies tinnitus, ear pain, hearing loss.  RESPIRATORY: Denies cough, wheeze, shortness of breath.  CARDIOVASCULAR: Denies chest pain palpitations, edema. GASTROINTESTINAL: Denies nausea, vomiting, diarrhea, abdominal pain.  GENITOURINARY: Denies dysuria, hematuria.  ENDOCRINE: Denies nocturia or thyroid problems. HEMATOLOGY AND LYMPHATIC: Denies easy bruising and bleeding.  SKIN: Denies rashes or lesions.  MUSCULOSKELETAL: Denies pain in neck, back, shoulder, knees, hips, or arthritic symptoms.  NEUROLOGIC: Denies paralysis, paresthesias.  PSYCHIATRIC: Denies anxiety or depressive  symptoms.  Otherwise, full review of systems performed by me is negative.   PAST MEDICAL HISTORY: Type 2 diabetes, non-insulin-requiring; however, complicated by peripheral vascular disease as well as nephropathy; history of Crohn disease; essential hypertension; as well as intermittent asthma.   SOCIAL HISTORY: Uses a walker for ambulation. No alcohol, tobacco, or drug use.   FAMILY HISTORY: Positive for hypertension as well as diabetes.   ALLERGIES: No known drug allergies.   HOME MEDICATIONS: Include citalopram 20 mg p.o. q. daily; mirtazapine 50 mg p.o. at bedtime; glimepiride 4 mg p.o. q. daily; atenolol 50 mg p.o. q. daily; Lasix 40 mg p.o. q. daily; potassium 20 mEq p.o. q. daily; levothyroxine 125 mg p.o. q. daily; multivitamin 1 tab p.o. q. daily.   PHYSICAL EXAMINATION:  VITAL SIGNS: Temperature 98.7, heart rate 69, blood pressure 114/66, saturating 96% on room air. Weight 130.2 kg, BMI 41.2.  GENERAL: Chronically ill-appearing African American female currently in no acute distress.  HEAD: Normocephalic, atraumatic.  EYES: Pupils equal, round, reactive to light. Extraocular muscles intact. No scleral icterus.  MOUTH: Dry mucosal membranes. Dentition poor. No abscess noted. EARS, NOSE, THROAT: Clear without exudates. No external lesions.  NECK: Supple. No thyromegaly. No nodules. No JVD.  PULMONARY: Clear to auscultation bilaterally without wheezes, rales, or rhonchi. No use of accessory muscles. Good respiratory effort.  CHEST: Nontender to palpation.  CARDIOVASCULAR: S1, S2, regular rate and rhythm. No murmurs, rubs, or gallops. Has 1+ edema of the lower extremities to shins bilaterally. Pedal pulses 2+ bilaterally. GASTROINTESTINAL: Soft, nontender, nondistended. No masses. Positive bowel sounds. No hepatosplenomegaly.  MUSCULOSKELETAL: No swelling, clubbing, or edema. Range of motion full in all extremities.  NEUROLOGIC: Cranial nerves II through XII intact. No gross focal  neurological deficits. Sensation intact. Reflexes intact.  SKIN: No  ulceration, lesions, rash, cyanosis. Skin warm, dry. Turgor intact.  PSYCHIATRIC: Mood and affect within normal limits. Patient awake, alert, oriented x 3. Insight and judgment are intact.   LABORATORY DATA: Sodium 139, potassium 3.9, chloride 105, bicarbonate 27, BUN 31, creatinine 1.68, glucose 164. LFTs: Protein 9.4, albumin 3.3, alkaline phosphatase 143, otherwise within normal limits. Troponin less than 0.02. WBC 9.4, hemoglobin 12.4, platelets of 223,000. Urinalysis negative for evidence of infection. Had a CAT scan performed as well. CT, head: No acute intracranial process. Chest x-ray performed: No acute cardiopulmonary process.  ASSESSMENT AND PLAN: A 54 year old [place] female with a history of type 2 diabetes complicated by nephropathy, as well as hypertension, presenting with altered mental status.  1.  Acute encephalopathy, improved to baseline. Suspect in relation to medications. Citalopram quetiapine.  Provide neurologic checks q. 4 hours, though she is at baseline currently.  2.  Acute kidney injury. Could be prerenal in etiology, given poor p.o. intake as well as continued use of diuretics. Improving baseline. Suspect in relation to her medications, citalopram quetiapine. Provide nitrites q. 4 hours, as she is at baseline. Currently acute kidney injury, prerenal in etiology, given poor p.o. intake as well as continued use of diuretics. Intravenous fluid hydration with normal saline. Follow renal function and urine output. Hold Lasix for now.  3.  Type 2 diabetes, non-insulin requiring, complicated by nephropathy. Hold p.o. agents. Add insulin sliding scale.  4.  Hypothyroidism. Continue Synthroid.  5.  Venous thromboembolism prophylaxis. Heparin subcutaneous.   CODE STATUS: Patient full code.  TIME SPENT: 45 minutes.    ____________________________ Cletis Athensavid K. Hower, MD dkh:ST D: 12/06/2013 22:58:53  ET T: 12/07/2013 02:11:40 ET JOB#: [id number]  cc: Cletis Athensavid K. Hower, MD, <Dictator> DAVID Synetta ShadowK HOWER MD ELECTRONICALLY SIGNED 12/08/2013 20:30

## 2014-05-11 NOTE — Discharge Summary (Signed)
PATIENT NAME:  Kirsten Young, Kirsten Young MR#:  [id number] DATE OF BIRTH:  January 12, 1961  DATE OF ADMISSION:  01/17/2013 DATE OF DISCHARGE:  01/19/2013  ADMISSION DIAGNOSES: 1.  Cellulitis of her foot with ulcers.  2.  Acute kidney injury.  3.  Diabetes.  4.  Hypertension.  5.  Anemia of chronic disease.   CONSULTATIONS: 1.  Dr. Orland Jarredroxler.  2.  Dr. Gilda CreaseSchnier. 3.  Maple HudsonKaren Sanders from Wound Care.    DISCHARGE LABS:  Sodium 135, potassium 5.2, chloride 105, bicarb 27, BUN 29, creatinine 1.64, glucose is 106.   Blood cultures are negative to date.   HOSPITAL COURSE:  This is a 54 year old female who presented from the [facility] for worsening foot ulcers despite being on p.o. antibiotics. For further details, please refer to the H and P. 1.  Ulcers. The patient has a decubitus of the right and left. She has been treated by the Wound Care. She has significant venostasis as well as chronic lymphedema. She was placed on broad-spectrum antibiotics. I had Dr. Orland Jarredroxler and Dr. Gilda CreaseSchnier see her. Dr. Orland Jarredroxler actually felt that the patient with good wound care could heal on its own. Maple HudsonKaren Sanders was also consulted for wound care treatment while the patient was here. At this time there is no further surgical necessity. I did speak with Dr. Orland Jarredroxler and he felt the patient was okay to be discharged. I will go ahead and discharge her with some antibiotics and she will follow up with the Wound Care as she has been. We do recommend better protection and padding for her ulcers on both feet and better control of her edema bilaterally to improve healing to these sites as well. We suggest Wound Care to treat with lymphedema and chronic venostases.  2.  Acute kidney injury, which improved with IV fluids. Her baseline is 1 to 1.4 but it has actually been at 1.6 so I suspect this may be her new baseline.  3.  Diabetes. We have discontinued metformin because her GFR is low. I started her on glipizide. She will need followup with her PCP.   4.  Hypertension. The patient was continued on lisinopril.  5.  Anemia of chronic disease. The patient can have outpatient followup regarding this.   DISCHARGE MEDICATIONS:  1.  Aspirin 81 mg daily. 2.  Lisinopril 20 mg 2 tablets daily.  3.  Lasix 20 mg daily.  4.  cerovite 15 mL daily.  5.  Effexor 75 mg daily.  6.  Triple antibiotic to right foot wounds daily.  7.  Vitamin C 500 mg b.i.d.  8.  Sliding scale insulin.  9.  Collagenase 250 units/g daily.  10.  Levaquin 250 mg per week daily.  11.  Cleocin  300 mg q.8hours x 10 days.  12.  Glipizide 5 mg daily.  13.  Do not take NSAIDs or nephrotoxic agents or metformin.   DISCHARGE WOUND CARE:  Cleanse left foot and left malleolus with normal saline, pat gently dry, apply Santyl ointment, 1/8-inch thickness to wound bed, left lateral foot wound and left lateral malleolus, place normal saline moist gauze dressing on top of ointment, top with dry dressing, wrap with Kerlix and secure with tape. Change daily; and for the right we recommend Bactroban and dry dressings daily.   DISCHARGE DIET:  ADA diet, low sodium.   DISCHARGE ACTIVITY: As tolerated.   DISCHARGE REFERRAL:  Back to the Wound Care Center. The patient will follow up with Dr. Gilda CreaseSchnier in 1  to 2 weeks. The patient should also go see her primary care physician regarding her diabetes, Dr. Madaline Guthrie.  TIME SPENT:  35 minutes. The patient is medically stable for discharge.   ____________________________ Graiden Henes P. Juliene Pina, MD spm:jm D: 01/19/2013 15:20:00 ET T: 01/19/2013 15:36:53 ET JOB#: [id number]  cc: Beat D. Madaline Guthrie, MD Renford Dills, MD Oliva Montecalvo P. Juliene Pina, MD, <Dictator> Deanna Wiater P Medina Degraffenreid MD ELECTRONICALLY SIGNED 01/19/2013 16:08

## 2014-05-11 NOTE — H&P (Signed)
PATIENT NAME:  Kirsten Young, Kirsten Young MR#:  [id number] DATE OF BIRTH:  01-11-1961  DATE OF ADMISSION:  01/17/2013  PRIMARY CARE PHYSICIAN: Dr. Madaline Guthrie   REFERRING ER PHYSICIAN: Dr. Loleta Rose   CHIEF COMPLAINT: Worsening pain of foot.   HISTORY OF PRESENT ILLNESS: A 54 year old female with past medical history of Crohn's disease, diabetes, hypertension, lower extremity swelling with lymphedema, infections and amputations, who is a resident of [facility] and following at wound care center for the wound. Today, she went to the wound care center for her follow up. For the last 3 weeks, on December 10 at wound care center, they did a culture of her wound, which grew E. coli which was sensitive to Bactrim, so started on Bactrim orally, which she is taking for the last 2 weeks, but noticed increased pain and swelling in the left foot, and so when she visited today, they referred her to the emergency room from wound care center for further management of her wound, as failure of outpatient therapy. They also sent her with a note from wound care center explaining about her previous workup and her wounds.   REVIEW OF SYSTEMS:    CONSTITUTIONAL: Negative for fever, fatigue, weakness, pain or weight loss.  EYES: No blurring, double vision, discharge or redness.  EARS, NOSE, THROAT: No tinnitus, ear pain or hearing loss.  RESPIRATORY: No cough, wheezing, hemoptysis or shortness of breath.  CARDIOVASCULAR: No chest pain, orthopnea, edema or palpitations.  GASTROINTESTINAL: No nausea, vomiting, diarrhea, abdominal pain.  GENITOURINARY: No dysuria, hematuria or increased frequency.  ENDOCRINE: No increased sweating. No heat or cold intolerance.  SKIN: There is severe lymphedematous swelling on both legs, with multiple open wounds and amputations of toes and feet, both sides, and there is some increased pain for the last few days.  NEUROLOGICAL: No numbness, weakness, tremors or vertigo.  PSYCHIATRIC: No anxiety,  insomnia, bipolar disorder.   PAST MEDICAL HISTORY: Obesity, Crohn's disease, diabetes mellitus type 2, hypertension, lower extremity swelling, and chronic wounds on both feet, status post amputations of toes.   FAMILY HISTORY: Hypertension, diabetes.   SOCIAL HISTORY:  Disabled. No smoking. No drinking alcohol. Lives in a nursing home.   CURRENT MEDICATIONS:  1.  Aspirin enteric-coated 81 mg once a day.  2.  Effexor 75 mg oral capsule once a day.  3.  Multivitamin 15 mL liquid once a day.  4.  Lasix 20 mg once a day.  5.  Lisinopril 20 mg 2 tablets once a day.  6.  Metformin 500 mg once a day.  7.  Triple antibiotic, apply topically on the right foot wound once a day.  8.  Vitamin C 500 mg oral tablet 2 times a day.  9.  Bactrim tablet orally for last 2 weeks.  VITAL SIGNS IN ER:  Temperature 97.9, pulse 72, respirations 18, blood pressure 115/56, and pulse ox 99% on room air.   PHYSICAL EXAMINATION:  GENERAL: The patient is fully alert and oriented to time, place and person, does not appear in any acute distress.  HEENT: Head and neck atraumatic. Conjunctivae and mucosa moist.  NECK: Supple. No JVD.  RESPIRATORY: Bilateral clear and equal air entry.  CARDIOVASCULAR: S1, S2 present. Regular. No murmur.  ABDOMEN: Soft, nontender. Bowel sounds present. No organomegaly.  SKIN: Bilateral lower legs are severely emphysematous, and chronic skin changes. Both feet have multiple ulcers and pus collections. LEGS:  Severe swelling and edema.  NEUROLOGICAL: Power 4/5. Generalized weakness.  PSYCHIATRIC: Does  not appear in any acute psychiatric illness at this time.   IMPORTANT LAB RESULTS:  Glucose 111, BUN 45, creatinine 1.80. In the past, in April 2014, creatinine was 1.06, sodium 135, potassium 4.6, chloride 105, CO2 of 27, calcium 9.1. WBC count 6.3, hemoglobin 9.7, platelet count 354, and MCV is 69. Right foot AP and lateral x-rays done today, which showed diffuse soft tissue swelling and  metallic foreign object in soft tissue of dorsal foot, without plain film evidence of acute osteomyelitis. Tibia and fibula on the right x-ray:  No acute osseous abnormality. Tibia and fibula left:  No radiographic evidence of osteomyelitis or other acute osseous abnormality, diffuse leg soft tissue edema and calcifications. Foot left AP and lateral x-ray:  Prominent dorsal foot soft tissue swelling, no radiographic evidence of osteomyelitis or acute osseous abnormality.   ASSESSMENT AND PLAN: A 54 year old female with multiple wounds on the foot and chronic lymphangitic swelling on the foot. Sent from wound care center with complaint of worsening of the swelling and pain, in spite of being on oral antibiotic.   1.  Cellulitis. Will give her IV vancomycin, Zosyn and Levaquin. Will call wound care consult for further management of her wound. Will also call Vascular team for assessment of her wounds and vascularity, if they need to do any further assessment or treatment to her healing issue.   2.  Acute renal failure. Will give normal saline and will follow BMP.   3.  Diabetes. Continue metformin for now, and insulin sliding scale coverage.   Total time spent on this admission is 50 minutes.     ____________________________ Hope PigeonVaibhavkumar G. Elisabeth PigeonVachhani, MD vgv:mr D: 01/17/2013 20:02:00 ET T: 01/17/2013 20:18:38 ET JOB#: [id number]  cc: Hope PigeonVaibhavkumar G. Elisabeth PigeonVachhani, MD, <Dictator> Wound Care Center  [facility] CenterVAIBHAVKUMAR [place] Gonsalves MD ELECTRONICALLY SIGNED 01/24/2013 13:51

## 2014-05-11 NOTE — Consult Note (Signed)
Brief Consult Note: Diagnosis: diabetes, ulcers,edema.   Patient was seen by consultant.   Consult note dictated.   Recommend further assessment or treatment.   Comments: Pt needs better protection and padding for ulcers on both feet.  Needs better control of edema bilat. legs to imoprive healing to these sites as well.  [facility] center treat lymphedema and venous stasis .  Electronic Signatures: Epimenio Sarinroxler, Emaree Chiu G (MD)  (Signed 01-Jan-15 12:08)  Authored: Brief Consult Note   Last Updated: 01-Jan-15 12:08 by Epimenio Sarinroxler, Amun Stemm G (MD)

## 2014-05-11 NOTE — H&P (Signed)
PATIENT NAME:  Kirsten Young, GLADU MR#:  [id number] DATE OF BIRTH:  12-Jun-1960  REFERRING PHYSICIAN: Lurena Joiner L. Lord, MD  PRIMARY CARE PHYSICIAN: Serita Sheller. Maryellen Pile, MD  CHIEF COMPLAINT: Confusion.   HISTORY OF PRESENT ILLNESS:  A 54 year old African American female with past medical history of type 2 diabetes, insulin requiring, with peripheral vascular disease, and neuropathy, hypertension, asthma, chronic lymphedema, who was found down by her family.  The family states that she altered mental status described as confusion by family present at bedside. She was found on the floor, lying about 24 hours on the ground in total, covered in urine and feces. Per the family, she was diagnosed with UTI by PCP about 4 days ago, started on Levaquin.  At this time, the patient is without any meaningful information given mental status and medical condition.   REVIEW OF SYSTEMS:  Unobtainable given mental status and medical condition.   PAST MEDICAL HISTORY: Type 2 diabetes, insulin requiring, complicated by peripheral vascular disease as well as peripheral neuropathy, Crohn's disease, hypertension, asthma, intermittent.   SOCIAL HISTORY: No alcohol, tobacco or drug use mentioned by family.   FAMILY HISTORY: Positive for hypertension, diabetes.   ALLERGIES: NO KNOWN DRUG ALLERGIES.   HOME MEDICATIONS: Include aspirin 81 mg p.o. q. daily, lisinopril 20 mg 2 tablets p.o. q. daily, Effexor 75 mg p.o. q. daily, glipizide 5 mg p.o. q. daily, insulin aspart  4 times daily before meals and at nighttime with sliding scale coverage, collagenase 250 units per gram topical ointment apply once daily, Lasix 20 mg p.o. q. daily, Levaquin 250 mg p.o. q. daily for seven days in total, multivitamin 1 tablet daily, vitamin C 500 mg p.o. b.i.d.   PHYSICAL EXAMINATION: VITAL SIGNS: Temperature 97.5, heart rate 70, respirations 20, blood pressure 125/59, saturating 99% on room air. Weight 124.8 kg, BMI 36.3.  GENERAL: Disheveled,  [place] American female, currently in minimal distress given mental status.  HEAD: Normocephalic, atraumatic.  EYES: Pupils equal, round and react to light. Extraocular muscles intact. No scleral icterus.  Mouth, dry mucosal membrane. Dentition intact. No abscess noted.  EARS, NOSE, THROAT:  Clear without exudates. No external lesions.  NECK: Supple. No thyromegaly. No nodules. No JVD.  PULMONARY: Clear to auscultation bilaterally without wheezes, rubs or rhonchi. No use of accessory muscles. Good respiratory effort.  CHEST: Nontender to palpation.  CARDIOVASCULAR: S1, S2, regular rate and rhythm. No murmurs, rubs or gallops. No edema. Pedal pulses 2+ bilaterally.  GASTROINTESTINAL: Soft, nontender, nondistended. No masses. Positive bowel sounds. No hepatosplenomegaly.  EXTREMITIES:  Pedal pulses diminished bilaterally. There is chronic and nonpitting edema to the knees bilaterally.  MUSCULOSKELETAL: No swelling or clubbing. Positive for edema as described above. She has toe amputations bilaterally. Range of motion appears to be full in all extremities.  NEUROLOGIC: Unable to fully assess given the patient's current mental status and medical condition; however, she is continuously babbling and able to move all extremities. At this time she is unable to follow any commands.  SKIN: No ulceration, lesions, rashes or cyanosis. Skin warm and dry. Turgor intact.  PSYCHIATRIC: Mood, affect agitated. She is awake; however, she is unable to respond to questioning, and she is incoherently babbling at this time. Insight and judgment are poor.   LABORATORY DATA AND IMAGING: Chest x-ray performed, no acute cardiopulmonary process. CT head performed, no intracranial process. Remainder of laboratory data: Sodium 132, potassium 4.4, chloride 102, bicarbonate 21, BUN 87, creatinine 4.07, glucose 187. LFTs: Albumin of 2.3  protein of 8.6, alkaline phosphatase 136, AST of 61, CK 1313.  Her WBC is 18.8, hemoglobin 11.6,  platelets of 290,000.  Urinalysis: WBCs 60, RBCs 7, leukocyte esterase 3+, bacteria 3+, epithelial cells 2.   ASSESSMENT AND PLAN: A 54 year old African American female with past medical history of type 2 diabetes, insulin requiring, as well as chronic lymphedema who was found down by her family, brought in to the hospital after she had been lying on the floor for about 24 to 48 hours and markedly confused. 1.  Sepsis, secondary to urinary tract infection. Meeting sepsis criteria by respiratory rate and leukocytosis present on arrival. Panculture included blood and urine. Antibiotic coverage with ceftriaxone, IV fluid hydration. Keep mean arterial pressure greater than 65.  2  Rhabdomyolysis, given by elevated CK and acute kidney injury. Provide IV fluid hydration. Follow urine output, renal function.  3.  Encephalopathy.  Likely metabolic in nature secondary to sepsis. Follow mental status while treating infection. If renal function does not improve and mental status does not improve despite antibiotic coverage, will require nephrology consult as potential complication of uremia.  4.  Hypertension. Hold angiotensin-converting enzyme inhibitors given acute kidney injury.  5.  Venous thromboembolism prophylaxis with heparin subcutaneous.   CODE STATUS: The patient is full code.   TIME SPENT: Forty-five minutes.    ____________________________ Cletis Athensavid K. Hower, MD dkh:LT D: 09/30/2013 21:56:06 ET T: 09/30/2013 22:34:24 ET JOB#: [id number]  cc: Cletis Athensavid K. Hower, MD, <Dictator> DAVID Synetta ShadowK HOWER MD ELECTRONICALLY SIGNED 10/01/2013 20:50

## 2014-05-11 NOTE — Consult Note (Signed)
PATIENT NAME:  Kirsten Young, Kirsten Young MR#:  [id number] DATE OF BIRTH:  1961/01/11  DATE OF CONSULTATION:  01/18/2013  REFERRING PHYSICIAN:   CONSULTING PHYSICIAN:  Gerrit Heck. Tullio Chausse, DPM  REASON FOR CONSULTATION:  Bilateral wounds, right heel, and the left fifth met tuberosity and left lateral malleolus.   HISTORY OF PRESENT ILLNESS:  The patient has been followed, I think, in the [facility] for a while and has developed a wound. She has got a history of ulcerations, some amputation to her toes secondary to diabetic complications and neuropathy. She was sent to the Emergency Room yesterday, or last night I think, because of some increasing pain in her foot and legs. She is 54 year old [place] American female.   PAST MEDICAL HISTORY:  1.  Obesity. 2.  Crohn's disease.  3.  Diabetes type 2.  4.  Hypertension.  5.  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   FAMILY HISTORY:  Includes diabetes, hypertension.   SOCIAL HISTORY:  She is disabled, denies smoking, denies EtOH. Lives in a nursing home.   HOME MEDICATIONS:  1.  Aspirin 81 mg daily.  2.  Effexor 75 mg daily.  3.  Lasix 20 mg a day.  4.  Lisinopril 20 mg b.i.d. 5.  Metformin 500 mg once a day. 6.  She has been putting some triple antibiotic topically on the right foot once a day where she has an ulcer on her heel.  7.  Bactrim tablet orally for the last 2 weeks.    PHYSICAL EXAMINATION: VITAL SIGNS:  Temperature is 97.3, pulse 78, respirations 18, blood pressure 109/68.  LOWER EXTREMITY EXAM:  The patient has severe venostasis and lymphedema bilaterally. She had a decubitus ulceration on her heel, which is superficial. It was partially sloughed off so I debrided it with a 15 blade and I did an excision of some the dead skin across that region. It is superficial and does not have much deep penetration at all. This is in toto about 2.5 x 3 cm,  approximately 3  cm in diameter as far as a whole blister is concerned. The left lateral malleolus is approximately 1.5 to 2 cm in diameter as well as the lateral fifth metatarsal tuberosity is about the same size. Both of them have fairly clean bases and I think they were debrided by a representative from the [facility] either today or yesterday. There is noted cellulitis around these regions, but she does have severe edema across the areas and Wound Care ordered a Santyl ointment dressing, which I think will be fine for those areas.   CLINICAL IMPRESSION:  Diabetic ulcerations, some likely mild cellulitis around the region at this time frame. Deep penetrated on the left foot. It did penetrate down to subcutaneous tissues. There are other bony areas, which are not too far separated, hopefully, but it did not appear that there was any penetration down to bone with any kind of probing at this point. The right heel ulcer is superficial with a large blister formation and dead skin on the region.   TREATMENT PLAN:  Debride the skin excisionally from the right heel ulcer, apply a wet-to-dry dressing on there with some wound treatment spray and heavy gauze and Kerlix padding to keep the pressure off the region on the lateral left ulcers. I put Santyl ointment on there with a wet-to-dry dressing and heavy gauze padding as well to reduce pressure and  friction. These things are not going to heal unless the pressure is reduced across the area and also the edema is brought under better control. I would recommend aggressive lymphedema reduction as well as appropriate pressure relief on the ulcers and continued dressings. I think Santyl should be fine. It  develops more granular tissue across the regions but the patient definitely needs pressure relief and reduction of edema in order to improve.  ____________________________ Gerrit Heck. Neziah Vogelgesang, DPM mgt:jm D: 01/18/2013 12:03:55 ET T: 01/18/2013 12:28:03  ET JOB#: [id number]  cc: Gerrit Heck Clevie Prout, DPM, <Dictator> Perry Mount MD ELECTRONICALLY SIGNED 01/30/2013 16:13

## 2014-05-11 NOTE — Discharge Summary (Signed)
PATIENT NAME:  Kirsten Young, Kirsten Young DATE OF BIRTH:  04-12-1960  DATE OF ADMISSION:  09/30/2013 DATE OF DISCHARGE:  10/03/2013.  ADMITTING DIAGNOSIS: Confusion.   DISCHARGE DIAGNOSES:  1.  Acute encephalopathy due to sepsis as a result of a urinary tract infection.  2.  Urinary tract infection due to her pansensitive Escherichia coli.  3.  Rhabdomyolysis, now CPK trending down.  4.  Acute encephalopathy due to urinary tract infection, now mental status back to baseline.  5.  Baseline cognitive deficits.  6.  Hard of hearing.  7.  Hypertension.  8.  Diabetes type 2.  9.  Peripheral neuropathy.  10. Crohn disease.  11. Asthma, intermittent.  12. History of hypertension, blood pressure low during hospitalization. Blood pressure medications stopped.   CONSULTANTS:  Case management, physical therapy.   LABORATORY DATA:  Admitting glucose 187, BUN 87, creatinine 4.09, sodium 132, potassium 4.4, chloride 102, CO2 is 21. LFTs showed a total protein 8.6, albumin 2.6, bilirubin total 0.5, alkaline phosphatase was 136, AST 61, ALT 51, troponin less than 0.02, CPK 1313, CK-MB 27.6, CPK on September 15 was 375.   Renal function today on September 16: BUN 50, creatinine 1.37. Blood cultures: No growth. Urine cultures showed Escherichia coli pansensitive to all antibiotics.   CT scan of the head without contrast showed no acute abnormality. No acute intracranial abnormality. Arthrosclerosis. No evidence of cervical spine fracture.   Chest x-ray shows normal cardiac silhouette, low lung volumes. No other abnormality.  HOSPITAL COURSE: Please refer to H and P done by the admitting physician. The patient is a 54 year old African American female with a history of diabetes type 2, peripheral vascular disease, neuropathy, hypertension, asthma, chronic lymphedema, who was found down by her family. The patient does have baseline cognitive deficits and also is hard of hearing. Was brought in acute  encephalopathy. The patient was noted to have acute renal failure and was noted to have a UTI, was noted to have sepsis on presentation. The patient received aggressive IV fluids. Was treated for her UTI with significant improvement in her mental status. Her urine culture was pansensitive. Antibiotics are changed to oral now. Her renal function is also significantly improved. The patient was on antihypertensives which have since been discontinued due to her blood pressure being low. The patient was evaluated by physical therapy and was recommended for skilled nursing facility at this time. Arrangements for skilled nursing facility are made. The patient is stable for discharge.   DISCHARGE MEDICATIONS: Cerovite 15 mL daily, Effexor 75 p.o. daily, triple antibiotic applied to right foot wound once a day, vitamin C 500 mg 1 tablet p.o. b.i.d., insulin sliding scale, collagenase 1 application topically daily, glipizide 5 mg daily, Tylenol 650 q. 4 hours p.r.n. for pain, Ceftin 500 mg 1 tablet p.o. b.i.d. x 5 days.   DIET: Low-sodium, carbohydrate-controlled diet.   ACTIVITY: As tolerated with PT evaluation and treatment. Follow up with MD at the  skilled nursing facility in 1-2 weeks.  Time spent on this patient's discharge 40 minutes.     ____________________________ Lacie ScottsShreyang H. Allena KatzPatel, MD shp:lt D: 10/03/2013 10:30:03 ET T: 10/03/2013 10:47:49 ET JOB#: [id number]  cc: Norell Brisbin H. Allena KatzPatel, MD, <Dictator> Charise CarwinSHREYANG H Larson Limones MD ELECTRONICALLY SIGNED 10/05/2013 8:42

## 2014-05-11 NOTE — Discharge Summary (Signed)
PATIENT NAME:  Kirsten Young, Kirsten Young MR#:  [id number] DATE OF BIRTH:  [birth year]  DATE OF ADMISSION:  12/06/2013 DATE OF DISCHARGE:  12/07/2013  ADMISSION DIAGNOSIS: Acute encephalopathy due to medications.   DISCHARGE DIAGNOSES:  1.  Acute encephalopathy, due to medications. 2.  Acute kidney injury.  3.  Type 2 diabetes, insulin requiring.  4.  Peripheral vascular disease.  5.  History of lymphedema.   CONSULTATIONS: None.   DIAGNOSTIC DATA: Pertinent laboratories at discharge: White blood cells 8.7, hemoglobin 10, hematocrit 33, platelets 189,000. Sodium 144, potassium 3.6, chloride 108, bicarb 28, BUN 33, creatinine 1.47, glucose 97.  TSH was low at 0.020.   HOSPITAL COURSE: This is a 54 year old female who was admitted with encephalopathy, altered mental status. For further details, please refer to the H and P.  1.  Acute encephalopathy, related to medication. The patient has improved. Her TSH is noted to be slightly low. She will need repeat TFTs as an outpatient by her primary care physician. She is completely back to her baseline. I suspect this is medication related to his citalopram. This has been held.  2.  Acute kidney injury, which improved with IV fluids.  3.  Type 2 diabetes, complicated by nephropathy. The patient will continue outpatient medications.  4.  Hypothyroidism. Again, TSH was low. The patient will need repeat TFTs as an outpatient and follow-up for her TSH.  5.  Lymphedema, which was stable.  DISCHARGE PHYSICAL EXAMINATION:  VITAL SIGNS: Temperature 99.2, pulse 69, respirations 18, blood pressure 101/66, 97 on room air.  GENERAL: The patient was alert and oriented x3.  CARDIOVASCULAR: Regular rate and rhythm. No murmurs, gallops, or rubs. PMI is not displaced.  LUNGS: Clear to auscultation without crackles, rales, rhonchi or wheezing. Normal to percussion.  BACK: No CVA or vertebral tenderness. ABDOMEN: Bowel sounds are positive. Nontender, nondistended. No  hepatosplenomegaly.  EXTREMITIES: No clubbing, cyanosis or edema. NEUROLOGIC: Cranial nerves II through XII are grossly intact.   DISCHARGE MEDICATIONS:  1.  K-Chlor 20 mEq daily.  2.  Lasix 40 mg daily.  3.  Glimepiride 4 mg daily.  4.  Atenolol 50 mg daily.  5.  Mirtazapine 15 mg at bedtime.  6.  Theravite 1 tablet daily.  7.  Synthroid 125 mcg daily.   DISCHARGE HOME HEALTH: Physical therapy, nurse and nurse aide.   DISCHARGE DIET: Low sodium, ADA diet.   DISCHARGE ACTIVITY: As tolerated.   DISCHARGE FOLLOWUP: The patient will follow up with Dr. Maryellen PileEason next week.   TIME SPENT: 37 minutes.  ____________________________ Janyth ContesSital P. Juliene PinaMody, MD spm:sb D: 12/07/2013 12:58:48 ET T: 12/07/2013 15:08:50 ET JOB#: [id number]  cc: Ziyon Soltau P. Juliene PinaMody, MD, <Dictator> Serita ShellerErnest B. Maryellen PileEason, MD Janyth ContesSITAL P Kylia Grajales MD ELECTRONICALLY SIGNED 12/08/2013 21:15

## 2014-05-19 NOTE — Op Note (Signed)
PATIENT NAME:  Kirsten Young, Kirsten Young MR#:  [id number] DATE OF BIRTH:  1960/10/14  DATE OF PROCEDURE:  03/11/2014  PREOPERATIVE DIAGNOSES:  1.  Ulceration right lower extremity which poorly healing.  2.  Diabetes.  3.  Hypertension.   POSTOPERATIVE DIAGNOSES:  1.  Ulceration right lower extremity which poorly healing.  2.  Diabetes.  3.  Hypertension.   PROCEDURES:  1.  Ultrasound guidance for vascular access, left femoral artery.  2.  Catheter placed in the right superficial femoral artery from left femoral approach.  3.  Aortogram and selective right lower extremity angiogram.  4.  StarClose closure device, right femoral artery.   SURGEON: Annice NeedyJason S. Amarrah Meinhart, MD.   ANESTHESIA: Local with moderate conscious sedation.   ESTIMATED BLOOD LOSS: Minimal.   FLUOROSCOPY TIME: Approximately 2 minutes.    CONTRAST:  35 mL of contrast were used.    INDICATION FOR PROCEDURE:  A 54 year old female with a long-standing ulcer of the right foot. Given her multiple atherosclerotic risk factors and likelihood of vascular disease particularly in the small vessels not well identified on noninvasive studies angiography was discussed for further evaluation and potential treatment. Risks and benefits were discussed. Informed consent was obtained.   DESCRIPTION OF PROCEDURE: The patient was brought to the vascular suite. Groins were shaved and prepped and a sterile surgical field was created. Left femoral head was localized with fluoroscopy. Ultrasound was used to visualize a patent left femoral artery.  It was accessed under direct ultrasound guidance without difficulty with Seldinger needle. A J-wire and 5 French sheath were then placed. Pigtail catheter was placed in the aorta at the L1-L2 level and AP aortogram was performed. This showed normal flow in the renal arteries bilaterally, although the left renal artery was low-lying, the aorta and iliac segments were widely patent without significant stenosis. I then used  the pigtail catheter and a J-wire and crossed the bifurcation and initially advanced the catheter to the right femoral head, later with a J-wire I advanced the catheter into the mid superficial femoral artery to give full opacification of the tibial vessels. Imaging demonstrated a widely patent right common femoral artery, profunda femoris artery, and superficial femoral artery, popliteal artery with normal tibial trifurcation. With the pigtail catheter advanced in the mid SFA tibial vessels were opacified and she was found to have good 3 vessel runoff distally without significant stenosis and good flow into her foot. At this point I elected to terminate the procedure. She had adequate flow with no role for endovascular revascularization. The diagnostic catheter was removed. Oblique arteriogram was performed to the left femoral artery and StarClose closure device was deployed in the usual fashion with excellent hemostatic result. The patient tolerated the procedure well and was taken to the recovery room in stable condition.    ____________________________ Annice NeedyJason S. Karlo Goeden, MD jsd:bu D: 03/11/2014 14:44:43 ET T: 03/11/2014 18:17:09 ET JOB#: [id number]  cc: Annice NeedyJason S. Enslee Bibbins, MD, <Dictator> Annice NeedyJASON S Brigit Doke MD ELECTRONICALLY SIGNED 03/28/2014 10:34

## 2014-12-05 ENCOUNTER — Emergency Department: Payer: Medicare Other

## 2014-12-05 ENCOUNTER — Encounter: Payer: Self-pay | Admitting: Emergency Medicine

## 2014-12-05 ENCOUNTER — Emergency Department
Admission: EM | Admit: 2014-12-05 | Discharge: 2014-12-05 | Disposition: A | Discharge status: Home / Self Care | Payer: Medicare Other | Attending: Emergency Medicine | Admitting: Emergency Medicine

## 2014-12-05 DIAGNOSIS — E119 Type 2 diabetes mellitus without complications: Secondary | ICD-10-CM | POA: Diagnosis not present

## 2014-12-05 DIAGNOSIS — H9190 Unspecified hearing loss, unspecified ear: Secondary | ICD-10-CM | POA: Diagnosis not present

## 2014-12-05 DIAGNOSIS — I10 Essential (primary) hypertension: Secondary | ICD-10-CM | POA: Diagnosis not present

## 2014-12-05 DIAGNOSIS — Z79899 Other long term (current) drug therapy: Secondary | ICD-10-CM | POA: Insufficient documentation

## 2014-12-05 DIAGNOSIS — N390 Urinary tract infection, site not specified: Secondary | ICD-10-CM | POA: Diagnosis not present

## 2014-12-05 DIAGNOSIS — R410 Disorientation, unspecified: Secondary | ICD-10-CM | POA: Insufficient documentation

## 2014-12-05 DIAGNOSIS — R300 Dysuria: Secondary | ICD-10-CM | POA: Diagnosis present

## 2014-12-05 DIAGNOSIS — F333 Major depressive disorder, recurrent, severe with psychotic symptoms: Secondary | ICD-10-CM | POA: Diagnosis not present

## 2014-12-05 DIAGNOSIS — J069 Acute upper respiratory infection, unspecified: Secondary | ICD-10-CM | POA: Diagnosis not present

## 2014-12-05 LAB — URINALYSIS COMPLETE WITH MICROSCOPIC (ARMC ONLY)
Bilirubin Urine: NEGATIVE
GLUCOSE, UA: NEGATIVE mg/dL
Ketones, ur: NEGATIVE mg/dL
Nitrite: POSITIVE — AB
PH: 5 (ref 5.0–8.0)
PROTEIN: 100 mg/dL — AB
Specific Gravity, Urine: 1.016 (ref 1.005–1.030)

## 2014-12-05 LAB — COMPREHENSIVE METABOLIC PANEL
ALT: 11 U/L — AB (ref 14–54)
ANION GAP: 6 (ref 5–15)
AST: 20 U/L (ref 15–41)
Albumin: 3.1 g/dL — ABNORMAL LOW (ref 3.5–5.0)
Alkaline Phosphatase: 104 U/L (ref 38–126)
BUN: 21 mg/dL — ABNORMAL HIGH (ref 6–20)
CHLORIDE: 109 mmol/L (ref 101–111)
CO2: 25 mmol/L (ref 22–32)
CREATININE: 1.02 mg/dL — AB (ref 0.44–1.00)
Calcium: 8.4 mg/dL — ABNORMAL LOW (ref 8.9–10.3)
Glucose, Bld: 140 mg/dL — ABNORMAL HIGH (ref 65–99)
POTASSIUM: 3.9 mmol/L (ref 3.5–5.1)
SODIUM: 140 mmol/L (ref 135–145)
Total Bilirubin: 0.4 mg/dL (ref 0.3–1.2)
Total Protein: 7.8 g/dL (ref 6.5–8.1)

## 2014-12-05 LAB — CBC
HCT: 37.9 % (ref 35.0–47.0)
Hemoglobin: 11.8 g/dL — ABNORMAL LOW (ref 12.0–16.0)
MCH: 22.9 pg — ABNORMAL LOW (ref 26.0–34.0)
MCHC: 31.1 g/dL — ABNORMAL LOW (ref 32.0–36.0)
MCV: 73.4 fL — ABNORMAL LOW (ref 80.0–100.0)
PLATELETS: 224 10*3/uL (ref 150–440)
RBC: 5.16 MIL/uL (ref 3.80–5.20)
RDW: 15.7 % — ABNORMAL HIGH (ref 11.5–14.5)
WBC: 7.5 10*3/uL (ref 3.6–11.0)

## 2014-12-05 MED ORDER — CEPHALEXIN 500 MG PO CAPS: 500.0000 mg | ORAL_CAPSULE | Freq: Four times a day (QID) | ORAL | Status: AC

## 2014-12-05 MED ORDER — DEXTROSE 5 % IV SOLN
1.0000 g | Freq: Once | INTRAVENOUS | Status: AC
  Administered 2014-12-05: 1 g via INTRAVENOUS
  Filled 2014-12-05: qty 10

## 2014-12-05 MED ORDER — SODIUM CHLORIDE 0.9 % IV BOLUS (SEPSIS)
1000.0000 mL | Freq: Once | INTRAVENOUS | Status: AC
  Administered 2014-12-05: 1000 mL via INTRAVENOUS

## 2014-12-05 NOTE — ED Notes (Signed)
Pt presents with urinary symptoms, pain, odor for one week. Pt with hx of UTI's.

## 2014-12-05 NOTE — ED Provider Notes (Signed)
[facility] Emergency Department Provider Note  ____________________________________________  Time seen: Approximately 6:49 PM  I have reviewed the triage vital signs and the nursing notes.   HISTORY  Chief Complaint Dysuria    HPI Kirsten Young is a 54 y.o. female with a history of HTN, diabetes mellitus presenting with increased urinary frequency, dysuria, suprapubic pain, and malodorous thick cloudy urine times several days. Patient also has had a cough productive of green phlegm without any shortness of breath, fever, sore throat, ear pain, rhinorrhea or congestion. She lives with her daughter, and there have been several brief episodes of changes in mental status that are described as not knowing what is going on around her for specific time.  She denies any chest pain, shortness of breath, syncope. No nausea or vomiting, no diarrhea.   Past Medical History  Diagnosis Date  . Renal disorder   . Hypertension   . Diabetes mellitus without complication (HCC)   . Thyroid disease     Patient Active Problem List   Diagnosis Date Noted  . Major depressive disorder, recurrent episode, severe, with psychotic behavior (HCC) 03/12/2014  . Chest pain due to psychological stress   . Altered mental state 12/19/2013  . Hypothyroidism 12/19/2013  . Hypertension 12/19/2013  . Diabetes mellitus without complication (HCC) 12/19/2013  . Depression 12/19/2013  . Fever 12/19/2013  . Acute encephalopathy 12/19/2013  . Acute sinusitis 12/19/2013    Past Surgical History  Procedure Laterality Date  . Foot surgery      Current Outpatient Rx  Name  Route  Sig  Dispense  Refill  . acetaminophen (TYLENOL) 325 MG tablet   Oral   Take 650 mg by mouth every 6 (six) hours as needed for moderate pain or headache.         Marland Kitchen atenolol (TENORMIN) 50 MG tablet   Oral   Take 50 mg by mouth daily.         . cephALEXin (KEFLEX) 500 MG capsule   Oral   Take 1 capsule  (500 mg total) by mouth 4 (four) times daily.   28 capsule   0   . furosemide (LASIX) 40 MG tablet   Oral   Take 40 mg by mouth daily.         Marland Kitchen glimepiride (AMARYL) 4 MG tablet   Oral   Take 4 mg by mouth daily with breakfast.         . levothyroxine (SYNTHROID, LEVOTHROID) 125 MCG tablet   Oral   Take 125 mcg by mouth daily before breakfast.         . lisinopril (PRINIVIL,ZESTRIL) 5 MG tablet   Oral   Take 5 mg by mouth daily.         . metFORMIN (GLUCOPHAGE) 500 MG tablet   Oral   Take by mouth 2 (two) times daily with a meal.         . potassium chloride SA (K-DUR,KLOR-CON) 20 MEQ tablet   Oral   Take 20 mEq by mouth daily.         . risperiDONE (RISPERDAL M-TABS) 1 MG disintegrating tablet   Oral   Take 1 tablet (1 mg total) by mouth at bedtime.   30 tablet   0     Allergies Review of patient's allergies indicates no known allergies.  No family history on file.  Social History Social History  Substance Use Topics  . Smoking status: Never Smoker   . Smokeless  tobacco: None  . Alcohol Use: Yes     Comment: occasionally    Review of Systems Constitutional: No fever/chills. No lightheadedness or syncope. Eyes: No visual changes. EARS:  Hard of hearing. ENT: No sore throat. No rhinorrhea or congestion. Cardiovascular: Denies chest pain, palpitations. Respiratory: Denies shortness of breath.  Positive productive cough. Gastrointestinal: Positive suprapubic abdominal pain.  No nausea, no vomiting.  No diarrhea.  No constipation. Genitourinary: Positive for increased urinary frequency, pain with urination, suprapubic pain, malodorous and cloudy urine. Musculoskeletal: Negative for back pain. Skin: Negative for rash. Neurological: Negative for headaches, focal weakness or numbness. Psychiatric:Intermittent mild confusion.  10-point ROS otherwise negative.  ____________________________________________   PHYSICAL EXAM:  VITAL SIGNS: ED  Triage Vitals  Enc Vitals Group     BP 12/05/14 1818 115/68 mmHg     Pulse --      Resp 12/05/14 1818 20     Temp 12/05/14 1818 98.6 F (37 C)     Temp Source 12/05/14 1818 Oral     SpO2 12/05/14 1818 98 %     Weight --      Height 12/05/14 1818 5\' 10"  (1.778 m)     Head Cir --      Peak Flow --      Pain Score --      Pain Loc --      Pain Edu? --      Excl. in GC? --     Constitutional: Patient is alert and answering questions appropriately. She is significantly hard of hearing and some of the history of present illness comes from her daughter. GCS is 15.  Eyes: Conjunctivae are normal.  EOMI. no discharge. Head: Atraumatic. No raccoon eyes or Battle sign. Nose: No congestion/rhinnorhea. Mouth/Throat: Mucous membranes are moist.  Neck: No stridor.  Supple.  No meningismus. Cardiovascular: Normal rate, regular rhythm. No murmurs, rubs or gallops.  Respiratory: Normal respiratory effort.  No retractions. Lungs CTAB.  No wheezes or rhonchi. Minimal Rales in the bases bilaterally. No accessory muscle use. No retractions. Able to speak in full sentences. Gastrointestinal: Abdomen is soft, nondistended. She has minimal tenderness and tenderness to palpation over the suprapubic area and no peritoneal signs, no guarding or rebound. Musculoskeletal: No LE edema.  Neurologic:  Face and smile are symmetric. No facial droop. Moves all extremities well.  Skin:  Skin is warm, dry and intact. No rash noted. Psychiatric: Mood and affect are normal. Speech and behavior are normal.  Normal judgement.  ____________________________________________   LABS (all labs ordered are listed, but only abnormal results are displayed)  Labs Reviewed  URINALYSIS COMPLETEWITH MICROSCOPIC ([facility] ONLY) - Abnormal; Notable for the following:    Color, Urine YELLOW (*)    APPearance CLOUDY (*)    Hgb urine dipstick 3+ (*)    Protein, ur 100 (*)    Nitrite POSITIVE (*)    Leukocytes, UA 3+ (*)    Bacteria,  UA MANY (*)    Squamous Epithelial / LPF 6-30 (*)    All other components within normal limits  CBC - Abnormal; Notable for the following:    Hemoglobin 11.8 (*)    MCV 73.4 (*)    MCH 22.9 (*)    MCHC 31.1 (*)    RDW 15.7 (*)    All other components within normal limits  COMPREHENSIVE METABOLIC PANEL - Abnormal; Notable for the following:    Glucose, Bld 140 (*)    BUN 21 (*)  Creatinine, Ser 1.02 (*)    Calcium 8.4 (*)    Albumin 3.1 (*)    ALT 11 (*)    All other components within normal limits  URINE CULTURE   ____________________________________________  EKG ED ECG REPORT I, Rockne Menghini, the attending physician, personally viewed and interpreted this ECG.   Date: 12/05/2014  EKG Time: 1938  Rate: 55  Rhythm: sinus bradycardia  Axis: Leftward  Intervals:none  ST&T Change: Nonspecific T-wave inversion in V1. No ST elevation.  ____________________________________________  RADIOLOGY  Dg Chest 2 View  12/05/2014  CLINICAL DATA:  Coughing for 3 days. Hx of hypertension EXAM: CHEST - 2 VIEW COMPARISON:  03/11/2014 FINDINGS: Relatively low lung volumes. Heart size upper limits normal. Atheromatous aorta. No pneumothorax. No effusion. Mild compression deformity in the mid thoracic spine stable. IMPRESSION: No acute cardiopulmonary disease. Electronically Signed   By: Corlis Leak M.D.   On: 12/05/2014 19:22    ____________________________________________   PROCEDURES  Procedure(s) performed: None  Critical Care performed: No ____________________________________________   INITIAL IMPRESSION / ASSESSMENT AND PLAN / ED COURSE  Pertinent labs & imaging results that were available during my care of the patient were reviewed by me and considered in my medical decision making (see chart for details).  54 y.o. female with a history of recurrent UTIs presenting with suprapubic pain, urinary frequency, malodorous urine. She is also having a cough. I will  evaluate her for UTI, renal insufficiency, and pneumonia. She has a good support system at home, so if she has a UTI we will make a determination about whether she can be treated as an outpatient or whether she requires admission to the hospital. I am concerned about the brief episode of altered mental status that the daughter describes, however at this time she is back to her normal baseline mental status.   The patient does have a urinary tract infection. She is at her normal mental status baseline at this time. She is afebrile and not showing any signs that would be concerning for sepsis. Her chest x-ray does not show pneumonia. Given her previous microbiology culture results, I will plan to discharge her home with Keflex. She will follow up with her primary care physician the next 1-2 days. ____________________________________________  FINAL CLINICAL IMPRESSION(S) / ED DIAGNOSES  Final diagnoses:  URI (upper respiratory infection)  UTI (lower urinary tract infection)  Disorientation      NEW MEDICATIONS STARTED DURING THIS VISIT:  Discharge Medication List as of 12/05/2014  8:54 PM    START taking these medications   Details  cephALEXin (KEFLEX) 500 MG capsule Take 1 capsule (500 mg total) by mouth 4 (four) times daily., Starting 12/05/2014, Until Thu 12/12/14, Print         Rockne Menghini, MD 12/05/14 2235

## 2014-12-05 NOTE — ED Notes (Signed)
Tolerating po fluids.  No N/V.

## 2014-12-05 NOTE — Discharge Instructions (Signed)
Please take the entire course of antibiotics, even if you're feeling better.   Please drink plenty of fluid to stay well-hydrated.   Please make a follow up appointment with your primary care physician.  Please return to the emergency department if you develop fever, inability to keep down fluids, shortness of breath, severe pain, or any other symptoms concerning to you.

## 2014-12-08 LAB — URINE CULTURE: Special Requests: NORMAL

## 2014-12-25 ENCOUNTER — Emergency Department
Admission: EM | Admit: 2014-12-25 | Discharge: 2014-12-25 | Disposition: A | Discharge status: Home / Self Care | Payer: Medicare Other | Attending: Emergency Medicine | Admitting: Emergency Medicine

## 2014-12-25 ENCOUNTER — Encounter: Payer: Self-pay | Admitting: *Deleted

## 2014-12-25 DIAGNOSIS — E119 Type 2 diabetes mellitus without complications: Secondary | ICD-10-CM | POA: Diagnosis not present

## 2014-12-25 DIAGNOSIS — N390 Urinary tract infection, site not specified: Secondary | ICD-10-CM

## 2014-12-25 DIAGNOSIS — Z79899 Other long term (current) drug therapy: Secondary | ICD-10-CM | POA: Diagnosis not present

## 2014-12-25 DIAGNOSIS — Z7984 Long term (current) use of oral hypoglycemic drugs: Secondary | ICD-10-CM | POA: Diagnosis not present

## 2014-12-25 DIAGNOSIS — I10 Essential (primary) hypertension: Secondary | ICD-10-CM | POA: Diagnosis not present

## 2014-12-25 DIAGNOSIS — R8299 Other abnormal findings in urine: Secondary | ICD-10-CM | POA: Diagnosis present

## 2014-12-25 LAB — URINALYSIS COMPLETE WITH MICROSCOPIC (ARMC ONLY)
Bilirubin Urine: NEGATIVE
Glucose, UA: NEGATIVE mg/dL
KETONES UR: NEGATIVE mg/dL
NITRITE: NEGATIVE
PH: 5 (ref 5.0–8.0)
PROTEIN: 100 mg/dL — AB
SPECIFIC GRAVITY, URINE: 1.018 (ref 1.005–1.030)

## 2014-12-25 MED ORDER — CIPROFLOXACIN HCL 250 MG PO TABS: 250.0000 mg | ORAL_TABLET | Freq: Two times a day (BID) | ORAL | Status: DC

## 2014-12-25 MED ORDER — CIPROFLOXACIN HCL 500 MG PO TABS
250.0000 mg | ORAL_TABLET | Freq: Once | ORAL | Status: AC
  Administered 2014-12-25: 250 mg via ORAL
  Filled 2014-12-25: qty 1

## 2014-12-25 NOTE — Discharge Instructions (Signed)

## 2014-12-25 NOTE — ED Notes (Signed)
Patient's daughter notified of upcoming discharge. Is on her way. Patient changed into clean diaper and pants. Assisted to wheelchair. Awaiting daughter to go over discharge instructions.

## 2014-12-25 NOTE — ED Provider Notes (Signed)
[facility] Emergency Department Provider Note  ____________________________________________  Time seen: Approximately 210 PM  I have reviewed the triage vital signs and the nursing notes.   HISTORY  Chief Complaint Recurrent UTI    HPI [name] is a 54 y.o. female with a history of hypertension and diabetes as well as frequent urinary tract infections was presenting today with burning with urination as well as dark cloudy urine since this past Sunday. The patient is also saying that she has intermittent suprapubic pain. However, denies any pain at this time. Denies any nausea vomiting or diarrhea.   Past Medical History  Diagnosis Date  . Renal disorder   . Hypertension   . Diabetes mellitus without complication (HCC)   . Thyroid disease     Patient Active Problem List   Diagnosis Date Noted  . Major depressive disorder, recurrent episode, severe, with psychotic behavior (HCC) 03/12/2014  . Chest pain due to psychological stress   . Altered mental state 12/19/2013  . Hypothyroidism 12/19/2013  . Hypertension 12/19/2013  . Diabetes mellitus without complication (HCC) 12/19/2013  . Depression 12/19/2013  . Fever 12/19/2013  . Acute encephalopathy 12/19/2013  . Acute sinusitis 12/19/2013    Past Surgical History  Procedure Laterality Date  . Foot surgery      Current Outpatient Rx  Name  Route  Sig  Dispense  Refill  . acetaminophen (TYLENOL) 325 MG tablet   Oral   Take 650 mg by mouth every 6 (six) hours as needed for moderate pain or headache.         Marland Kitchen. atenolol (TENORMIN) 50 MG tablet   Oral   Take 50 mg by mouth daily.         . furosemide (LASIX) 40 MG tablet   Oral   Take 40 mg by mouth daily.         Marland Kitchen. glimepiride (AMARYL) 4 MG tablet   Oral   Take 4 mg by mouth daily with breakfast.         . levothyroxine (SYNTHROID, LEVOTHROID) 125 MCG tablet   Oral   Take 125 mcg by mouth daily before breakfast.          . lisinopril (PRINIVIL,ZESTRIL) 5 MG tablet   Oral   Take 5 mg by mouth daily.         . metFORMIN (GLUCOPHAGE) 500 MG tablet   Oral   Take by mouth 2 (two) times daily with a meal.         . potassium chloride SA (K-DUR,KLOR-CON) 20 MEQ tablet   Oral   Take 20 mEq by mouth daily.         . risperiDONE (RISPERDAL M-TABS) 1 MG disintegrating tablet   Oral   Take 1 tablet (1 mg total) by mouth at bedtime.   30 tablet   0     Allergies Review of patient's allergies indicates no known allergies.  No family history on file.  Social History Social History  Substance Use Topics  . Smoking status: Never Smoker   . Smokeless tobacco: None  . Alcohol Use: No    Review of Systems Constitutional: No fever/chills Eyes: No visual changes. ENT: No sore throat. Cardiovascular: Denies chest pain. Respiratory: Denies shortness of breath. Gastrointestinal: No abdominal pain.  No nausea, no vomiting.  No diarrhea.  No constipation. Genitourinary: As above  Musculoskeletal: Negative for back pain. Skin: Negative for rash. Neurological: Negative for headaches, focal weakness or numbness.  10-point ROS otherwise negative.  ____________________________________________   PHYSICAL EXAM:  VITAL SIGNS: ED Triage Vitals  Enc Vitals Group     BP 12/25/14 1114 116/70 mmHg     Pulse Rate 12/25/14 1114 74     Resp 12/25/14 1114 18     Temp 12/25/14 1114 98.5 F (36.9 C)     Temp Source 12/25/14 1114 Oral     SpO2 12/25/14 1114 98 %     Weight 12/25/14 1114 289 lb (131.09 kg)     Height 12/25/14 1114  (1.778 m)     Head Cir --      Peak Flow --      Pain Score --      Pain Loc --      Pain Edu? --      Excl. in GC? --     Constitutional: Alert and oriented. Well appearing and in no acute distress. Eyes: Conjunctivae are normal. PERRL. EOMI. Head: Atraumatic. Nose: No congestion/rhinnorhea. Mouth/Throat: Mucous membranes are moist.  Oropharynx  non-erythematous. Neck: No stridor.   Cardiovascular: Normal rate, regular rhythm. Grossly normal heart sounds.  Good peripheral circulation. Respiratory: Normal respiratory effort.  No retractions. Lungs CTAB. Gastrointestinal: Soft with mild suprapubic tenderness palpation. There is no rebound or guarding.. No distention. No abdominal bruits. No CVA tenderness. Musculoskeletal: No lower extremity tenderness nor edema.  No joint effusions. Neurologic:  Normal speech and language. No gross focal neurologic deficits are appreciated. No gait instability. Skin:  Skin is warm, dry and intact. No rash noted. Psychiatric: Mood and affect are normal. Speech and behavior are normal.  ____________________________________________   LABS (all labs ordered are listed, but only abnormal results are displayed)  Labs Reviewed  URINALYSIS COMPLETEWITH MICROSCOPIC ([facility] ONLY) - Abnormal; Notable for the following:    Color, Urine YELLOW (*)    APPearance CLOUDY (*)    Hgb urine dipstick 3+ (*)    Protein, ur 100 (*)    Leukocytes, UA 1+ (*)    Bacteria, UA RARE (*)    Squamous Epithelial / LPF 0-5 (*)    All other components within normal limits  URINE CULTURE   ____________________________________________  EKG   ____________________________________________  RADIOLOGY   ____________________________________________   PROCEDURES     INITIAL IMPRESSION / ASSESSMENT AND PLAN / ED COURSE  Pertinent labs & imaging results that were available during my care of the patient were reviewed by me and considered in my medical decision making (see chart for details).  ----------------------------------------- 2:40 PM on 12/25/2014 -----------------------------------------  Reviewed patient's recent cultures which grew supersensitive bacteria. We'll discharge with Cipro as well as give first dose in the emergency department. Plan the patient who understands and is willing to  comply. ____________________________________________   FINAL CLINICAL IMPRESSION(S) / ED DIAGNOSES  Urinary tract infection.    Myrna Blazer, MD 12/25/14 [phone]

## 2014-12-25 NOTE — ED Notes (Signed)
Patients daughter, [name], called. Requesting to be called back with the POC is updated. [phone].

## 2014-12-25 NOTE — ED Notes (Signed)
Pt here with family member c/o of UTI symptoms.  Dark, cloudy, urine, with odor and blood in urine.  Pt has been treated for UTI recently.

## 2014-12-25 NOTE — ED Notes (Signed)
Attempted to call patients daughter regarding patients discharge. Message left.

## 2014-12-27 LAB — URINE CULTURE

## 2015-01-26 ENCOUNTER — Emergency Department: Payer: Medicare Other

## 2015-01-26 ENCOUNTER — Encounter: Payer: Self-pay | Admitting: Emergency Medicine

## 2015-01-26 ENCOUNTER — Inpatient Hospital Stay
Admission: EM | Admit: 2015-01-26 | Discharge: 2015-01-31 | DRG: 195 | Disposition: A | Discharge status: Skilled Nursing Facility | Payer: Medicare Other | Attending: Internal Medicine | Admitting: Internal Medicine

## 2015-01-26 DIAGNOSIS — F22 Delusional disorders: Secondary | ICD-10-CM | POA: Diagnosis present

## 2015-01-26 DIAGNOSIS — F29 Unspecified psychosis not due to a substance or known physiological condition: Secondary | ICD-10-CM | POA: Diagnosis not present

## 2015-01-26 DIAGNOSIS — E1151 Type 2 diabetes mellitus with diabetic peripheral angiopathy without gangrene: Secondary | ICD-10-CM | POA: Diagnosis present

## 2015-01-26 DIAGNOSIS — I10 Essential (primary) hypertension: Secondary | ICD-10-CM | POA: Diagnosis present

## 2015-01-26 DIAGNOSIS — E039 Hypothyroidism, unspecified: Secondary | ICD-10-CM | POA: Diagnosis present

## 2015-01-26 DIAGNOSIS — H9190 Unspecified hearing loss, unspecified ear: Secondary | ICD-10-CM | POA: Diagnosis present

## 2015-01-26 DIAGNOSIS — J189 Pneumonia, unspecified organism: Secondary | ICD-10-CM | POA: Diagnosis present

## 2015-01-26 DIAGNOSIS — R410 Disorientation, unspecified: Secondary | ICD-10-CM

## 2015-01-26 DIAGNOSIS — H547 Unspecified visual loss: Secondary | ICD-10-CM | POA: Diagnosis present

## 2015-01-26 DIAGNOSIS — Z899 Acquired absence of limb, unspecified: Secondary | ICD-10-CM

## 2015-01-26 DIAGNOSIS — F209 Schizophrenia, unspecified: Secondary | ICD-10-CM | POA: Diagnosis present

## 2015-01-26 DIAGNOSIS — R079 Chest pain, unspecified: Secondary | ICD-10-CM | POA: Diagnosis present

## 2015-01-26 LAB — COMPREHENSIVE METABOLIC PANEL
ALBUMIN: 3.7 g/dL (ref 3.5–5.0)
ALT: 9 U/L — ABNORMAL LOW (ref 14–54)
ANION GAP: 3 — AB (ref 5–15)
AST: 16 U/L (ref 15–41)
Alkaline Phosphatase: 137 U/L — ABNORMAL HIGH (ref 38–126)
BILIRUBIN TOTAL: 0.5 mg/dL (ref 0.3–1.2)
BUN: 15 mg/dL (ref 6–20)
CO2: 28 mmol/L (ref 22–32)
Calcium: 8.6 mg/dL — ABNORMAL LOW (ref 8.9–10.3)
Chloride: 108 mmol/L (ref 101–111)
Creatinine, Ser: 0.91 mg/dL (ref 0.44–1.00)
GFR calc Af Amer: >60 mL/min (ref >60)
GFR calc non Af Amer: >60 mL/min (ref >60)
GLUCOSE: 119 mg/dL — AB (ref 65–99)
POTASSIUM: 4.2 mmol/L (ref 3.5–5.1)
SODIUM: 139 mmol/L (ref 135–145)
TOTAL PROTEIN: 8.4 g/dL — AB (ref 6.5–8.1)

## 2015-01-26 LAB — CBC WITH DIFFERENTIAL/PLATELET
BASOS PCT: 1 %
Basophils Absolute: 0.1 10*3/uL (ref 0–0.1)
EOS ABS: 0.1 10*3/uL (ref 0–0.7)
EOS PCT: 1 %
HEMATOCRIT: 38.1 % (ref 35.0–47.0)
Hemoglobin: 11.9 g/dL — ABNORMAL LOW (ref 12.0–16.0)
Lymphocytes Relative: 27 %
Lymphs Abs: 1.7 10*3/uL (ref 1.0–3.6)
MCH: 22.4 pg — ABNORMAL LOW (ref 26.0–34.0)
MCHC: 31.2 g/dL — AB (ref 32.0–36.0)
MCV: 71.7 fL — ABNORMAL LOW (ref 80.0–100.0)
MONO ABS: 0.5 10*3/uL (ref 0.2–0.9)
MONOS PCT: 8 %
Neutro Abs: 4 10*3/uL (ref 1.4–6.5)
Neutrophils Relative %: 63 %
PLATELETS: 227 10*3/uL (ref 150–440)
RBC: 5.32 MIL/uL — ABNORMAL HIGH (ref 3.80–5.20)
RDW: 16.1 % — AB (ref 11.5–14.5)
WBC: 6.4 10*3/uL (ref 3.6–11.0)

## 2015-01-26 LAB — URINALYSIS COMPLETE WITH MICROSCOPIC (ARMC ONLY)
BILIRUBIN URINE: NEGATIVE
Bacteria, UA: NONE SEEN
Glucose, UA: NEGATIVE mg/dL
HGB URINE DIPSTICK: NEGATIVE
KETONES UR: NEGATIVE mg/dL
LEUKOCYTES UA: NEGATIVE
Nitrite: NEGATIVE
PH: 5 (ref 5.0–8.0)
Protein, ur: NEGATIVE mg/dL
Specific Gravity, Urine: 1.017 (ref 1.005–1.030)

## 2015-01-26 LAB — TROPONIN I: Troponin I: <0.03 ng/mL (ref <0.031)

## 2015-01-26 MED ORDER — INSULIN ASPART 100 UNIT/ML ~~LOC~~ SOLN
0.0000 [IU] | Freq: Three times a day (TID) | SUBCUTANEOUS | Status: DC
  Administered 2015-01-27 – 2015-01-28 (×3): 1 [IU] via SUBCUTANEOUS
  Filled 2015-01-26 (×2): qty 1
  Filled 2015-01-26: qty 2

## 2015-01-26 MED ORDER — DEXTROSE 5 % IV SOLN
1.0000 g | INTRAVENOUS | Status: DC
  Administered 2015-01-27 (×2): 1 g via INTRAVENOUS
  Filled 2015-01-26 (×3): qty 10

## 2015-01-26 MED ORDER — METFORMIN HCL 500 MG PO TABS
500.0000 mg | ORAL_TABLET | Freq: Two times a day (BID) | ORAL | Status: DC
  Administered 2015-01-27 – 2015-01-31 (×8): 500 mg via ORAL
  Filled 2015-01-26 (×9): qty 1

## 2015-01-26 MED ORDER — LEVOFLOXACIN IN D5W 500 MG/100ML IV SOLN: 500.0000 mg | INTRAVENOUS | Status: DC

## 2015-01-26 MED ORDER — INSULIN ASPART 100 UNIT/ML ~~LOC~~ SOLN: 0.0000 [IU] | Freq: Every day | SUBCUTANEOUS | Status: DC

## 2015-01-26 MED ORDER — SODIUM CHLORIDE 0.9 % IV SOLN
INTRAVENOUS | Status: DC
  Administered 2015-01-27 (×2): via INTRAVENOUS

## 2015-01-26 MED ORDER — RISPERIDONE 1 MG PO TBDP
1.0000 mg | ORAL_TABLET | Freq: Every day | ORAL | Status: DC
  Administered 2015-01-27 – 2015-01-29 (×4): 1 mg via ORAL
  Filled 2015-01-26 (×4): qty 1

## 2015-01-26 MED ORDER — ACETAMINOPHEN 650 MG RE SUPP: 650.0000 mg | Freq: Four times a day (QID) | RECTAL | Status: DC | PRN

## 2015-01-26 MED ORDER — ATENOLOL 25 MG PO TABS
50.0000 mg | ORAL_TABLET | Freq: Every day | ORAL | Status: DC
  Administered 2015-01-29 – 2015-01-30 (×2): 50 mg via ORAL
  Filled 2015-01-26 (×3): qty 2

## 2015-01-26 MED ORDER — AZITHROMYCIN 250 MG PO TABS
250.0000 mg | ORAL_TABLET | Freq: Every day | ORAL | Status: DC
  Administered 2015-01-27: 250 mg via ORAL
  Filled 2015-01-26: qty 1

## 2015-01-26 MED ORDER — LEVOTHYROXINE SODIUM 75 MCG PO TABS
125.0000 ug | ORAL_TABLET | Freq: Every day | ORAL | Status: DC
  Administered 2015-01-27 – 2015-01-31 (×5): 125 ug via ORAL
  Filled 2015-01-26 (×5): qty 1

## 2015-01-26 MED ORDER — ACETAMINOPHEN 325 MG PO TABS: 650.0000 mg | ORAL_TABLET | Freq: Four times a day (QID) | ORAL | Status: DC | PRN

## 2015-01-26 MED ORDER — AZITHROMYCIN 250 MG PO TABS
500.0000 mg | ORAL_TABLET | Freq: Every day | ORAL | Status: AC
  Administered 2015-01-27: 500 mg via ORAL
  Filled 2015-01-26: qty 2

## 2015-01-26 MED ORDER — IPRATROPIUM-ALBUTEROL 0.5-2.5 (3) MG/3ML IN SOLN
3.0000 mL | Freq: Four times a day (QID) | RESPIRATORY_TRACT | Status: DC
  Administered 2015-01-27 (×3): 3 mL via RESPIRATORY_TRACT
  Filled 2015-01-26 (×3): qty 3

## 2015-01-26 MED ORDER — ENOXAPARIN SODIUM 40 MG/0.4ML ~~LOC~~ SOLN
40.0000 mg | Freq: Every day | SUBCUTANEOUS | Status: DC
  Administered 2015-01-27: 40 mg via SUBCUTANEOUS
  Filled 2015-01-26: qty 0.4

## 2015-01-26 MED ORDER — LISINOPRIL 5 MG PO TABS
5.0000 mg | ORAL_TABLET | Freq: Every day | ORAL | Status: DC
  Administered 2015-01-29 – 2015-01-31 (×3): 5 mg via ORAL
  Filled 2015-01-26 (×4): qty 1

## 2015-01-26 NOTE — ED Notes (Signed)
Pt's sister phone number [phone]. Would like to receive call for any updates

## 2015-01-26 NOTE — ED Provider Notes (Signed)
[facility] Emergency Department Provider Note    ____________________________________________  Time seen: 1600  I have reviewed the triage vital signs and the nursing notes.   HISTORY  Chief Complaint Fall and Altered Mental Status   History limited by: Altered Mental Status, some history obtained from family   HPI Kirsten Young is a 55 y.o. female with history of DM, renal disorder, htn who presents to the emergency department today because of concern for confusion.Family states that they noticed this today. It has been constant since they noticed a. They described the main thing that they noticed which was in usual as the patient was no longer walking. The patient was crawling to get around. They state that the patient had somewhat similar symptoms when she had a UTI number of weeks ago. Patient herself denies any fevers. She denies any chest pain shortness of breath, abdominal pain, nausea vomiting or diarrhea.     Past Medical History  Diagnosis Date  . Renal disorder   . Hypertension   . Diabetes mellitus without complication (HCC)   . Thyroid disease     Patient Active Problem List   Diagnosis Date Noted  . Major depressive disorder, recurrent episode, severe, with psychotic behavior (HCC) 03/12/2014  . Chest pain due to psychological stress   . Altered mental state 12/19/2013  . Hypothyroidism 12/19/2013  . Hypertension 12/19/2013  . Diabetes mellitus without complication (HCC) 12/19/2013  . Depression 12/19/2013  . Fever 12/19/2013  . Acute encephalopathy 12/19/2013  . Acute sinusitis 12/19/2013    Past Surgical History  Procedure Laterality Date  . Foot surgery      Current Outpatient Rx  Name  Route  Sig  Dispense  Refill  . acetaminophen (TYLENOL) 325 MG tablet   Oral   Take 650 mg by mouth every 6 (six) hours as needed for moderate pain or headache.         Marland Kitchen atenolol (TENORMIN) 50 MG tablet   Oral   Take 50 mg by  mouth daily.         . ciprofloxacin (CIPRO) 250 MG tablet   Oral   Take 1 tablet (250 mg total) by mouth 2 (two) times daily.   6 tablet   0   . furosemide (LASIX) 40 MG tablet   Oral   Take 40 mg by mouth daily.         Marland Kitchen glimepiride (AMARYL) 4 MG tablet   Oral   Take 4 mg by mouth daily with breakfast.         . levothyroxine (SYNTHROID, LEVOTHROID) 125 MCG tablet   Oral   Take 125 mcg by mouth daily before breakfast.         . lisinopril (PRINIVIL,ZESTRIL) 5 MG tablet   Oral   Take 5 mg by mouth daily.         . metFORMIN (GLUCOPHAGE) 500 MG tablet   Oral   Take by mouth 2 (two) times daily with a meal.         . potassium chloride SA (K-DUR,KLOR-CON) 20 MEQ tablet   Oral   Take 20 mEq by mouth daily.         . risperiDONE (RISPERDAL M-TABS) 1 MG disintegrating tablet   Oral   Take 1 tablet (1 mg total) by mouth at bedtime.   30 tablet   0     Allergies Review of patient's allergies indicates no known allergies.  History reviewed. No pertinent  family history.  Social History Social History  Substance Use Topics  . Smoking status: Never Smoker   . Smokeless tobacco: None  . Alcohol Use: No    Review of Systems  Constitutional: Negative for fever. Cardiovascular: Negative for chest pain. Respiratory: Negative for shortness of breath. Gastrointestinal: Negative for abdominal pain, vomiting and diarrhea. Neurological: Negative for headaches, focal weakness or numbness.   10-point ROS otherwise negative.  ____________________________________________   PHYSICAL EXAM:  VITAL SIGNS: ED Triage Vitals  Enc Vitals Group     BP 01/26/15 1516 110/79 mmHg     Pulse Rate 01/26/15 1516 74     Resp 01/26/15 1516 15     Temp 01/26/15 1516 98.2 F (36.8 C)     Temp Source 01/26/15 1516 Oral     SpO2 01/26/15 1516 98 %     Weight 01/26/15 1516 290 lb (131.543 kg)     Height 01/26/15 1516 5\' 11"  (1.803 m)     Head Cir --      Peak Flow  --      Pain Score 01/26/15 1545 3   Constitutional: Alert and oriented. Well appearing and in no distress. Eyes: Conjunctivae are normal. PERRL. Normal extraocular movements. ENT   Head: Normocephalic and atraumatic.   Nose: No congestion/rhinnorhea.   Mouth/Throat: Mucous membranes are moist.   Neck: No stridor. Hematological/Lymphatic/Immunilogical: No cervical lymphadenopathy. Cardiovascular: Normal rate, regular rhythm.  No murmurs, rubs, or gallops. Respiratory: Normal respiratory effort without tachypnea nor retractions. Breath sounds are clear and equal bilaterally. No wheezes/rales/rhonchi. Gastrointestinal: Soft and nontender. No distention.  Genitourinary: Deferred Musculoskeletal: Normal range of motion in all extremities. No joint effusions.  No lower extremity tenderness nor edema. Neurologic:  Normal speech and language. No gross focal neurologic deficits are appreciated.  Skin:  Skin is warm, dry and intact. No rash noted. Psychiatric: Mood and affect are normal. Speech and behavior are normal. Patient exhibits appropriate insight and judgment.  ____________________________________________    LABS (pertinent positives/negatives)  WBC 6.4 Hgb 11.9 Cr 0.91 Trop <0.03  ____________________________________________   EKG  I, Phineas SemenGraydon Salvatore Shear, attending physician, personally viewed and interpreted this EKG  EKG Time: 1539 Rate: 82 Rhythm: normal sinus rhythm Axis: normal Intervals: qtc 59 QRS: narrow, q waves V1 ST changes: no st elevation Impression: abnormal ekg   ____________________________________________    RADIOLOGY CXR IMPRESSION: Patchy left basilar opacity, suspicious for pneumonia in the setting of cough.  CT head  IMPRESSION: No acute intracranial abnormality is noted.  Worsening sinus disease as described.   ____________________________________________   PROCEDURES  Procedure(s) performed: None  Critical Care  performed: No  ____________________________________________   INITIAL IMPRESSION / ASSESSMENT AND PLAN / ED COURSE  Pertinent labs & imaging results that were available during my care of the patient were reviewed by me and considered in my medical decision making (see chart for details).  Patient presented to the emergency department today because of concerns for confusion by family. Head CT was negative for any acute process. Blood work unremarkable. Patient's chest x-ray did show possible pneumonia. Given the confusion to have some concerns for infection. Will admit to hospital service.  ____________________________________________   FINAL CLINICAL IMPRESSION(S) / ED DIAGNOSES  Final diagnoses:  Confusion     Phineas SemenGraydon Carolyn Sylvia, MD  [id number]

## 2015-01-26 NOTE — H&P (Signed)
[facility] -  at [facility]   PATIENT NAME: Kirsten Young    MR#:  [id number]  DATE OF BIRTH:  May 03, 1960  DATE OF ADMISSION:  01/26/2015  PRIMARY CARE PHYSICIAN: Derwood Kaplan, MD   REQUESTING/REFERRING PHYSICIAN: Dr. Derrill Kay  CHIEF COMPLAINT:   Chief Complaint  Patient presents with  . Fall  . Altered Mental Status    HISTORY OF PRESENT ILLNESS:  Kirsten Young  is a 55 y.o. female brought in by family for altered mental status and a fall today. Patient states that she had a fall today and hit her neck on the wall. Her neck initially hurts but now it's okay. She states she lives with her sister but her sister does not want her back. Unfortunately no family at the bedside at time of admission and I left a message for the daughter on the phone. As per the ER physician, the family was concerned about her mental status. In the ER, a chest x-ray showed a left lower lobe pneumonia and hospitalist services were contacted for further evaluation.  PAST MEDICAL HISTORY:   Past Medical History  Diagnosis Date  . Renal disorder   . Hypertension   . Diabetes mellitus without complication (HCC)   . Thyroid disease     PAST SURGICAL HISTORY:   Past Surgical History  Procedure Laterality Date  . Foot surgery      SOCIAL HISTORY:   Social History  Substance Use Topics  . Smoking status: Never Smoker   . Smokeless tobacco: Not on file  . Alcohol Use: No    FAMILY HISTORY:   Family History  Problem Relation Age of Onset  . Diabetes Father     DRUG ALLERGIES:  No Known Allergies  REVIEW OF SYSTEMS:  CONSTITUTIONAL: No fever, fatigue or weakness.  EYES: No blurred or double vision. Can't see well. EARS, NOSE, AND THROAT: No tinnitus or ear pain. No sore throat. Positive for runny nose RESPIRATORY: Positive for cough, no shortness of breath, wheezing or hemoptysis.  CARDIOVASCULAR: No chest pain, orthopnea, edema.  GASTROINTESTINAL: No  nausea, vomiting, diarrhea or abdominal pain. No blood in bowel movements GENITOURINARY: No dysuria, hematuria.  ENDOCRINE: No polyuria, nocturia,  HEMATOLOGY: No anemia, easy bruising or bleeding SKIN: No rash or lesion. MUSCULOSKELETAL:  Some neck pain earlier  NEUROLOGIC: No tingling, numbness, weakness.  PSYCHIATRY: No anxiety or depression.   MEDICATIONS AT HOME:   Prior to Admission medications   Medication Sig Start Date End Date Taking? Authorizing Provider  acetaminophen (TYLENOL) 325 MG tablet Take 650 mg by mouth every 6 (six) hours as needed for moderate pain or headache.    Historical Provider, MD  atenolol (TENORMIN) 50 MG tablet Take 50 mg by mouth daily.    Historical Provider, MD  ciprofloxacin (CIPRO) 250 MG tablet Take 1 tablet (250 mg total) by mouth 2 (two) times daily. 12/25/14   Myrna Blazer, MD  furosemide (LASIX) 40 MG tablet Take 40 mg by mouth daily.    Historical Provider, MD  glimepiride (AMARYL) 4 MG tablet Take 4 mg by mouth daily with breakfast.    Historical Provider, MD  levothyroxine (SYNTHROID, LEVOTHROID) 125 MCG tablet Take 125 mcg by mouth daily before breakfast.    Historical Provider, MD  lisinopril (PRINIVIL,ZESTRIL) 5 MG tablet Take 5 mg by mouth daily.    Historical Provider, MD  metFORMIN (GLUCOPHAGE) 500 MG tablet Take by mouth 2 (two) times daily with a meal.  Historical Provider, MD  potassium chloride SA (K-DUR,KLOR-CON) 20 MEQ tablet Take 20 mEq by mouth daily.    Historical Provider, MD  risperiDONE (RISPERDAL M-TABS) 1 MG disintegrating tablet Take 1 tablet (1 mg total) by mouth at bedtime. 03/14/14   Earney Navy, NP    Medication reconciliation process still undergoing  VITAL SIGNS:  Blood pressure 147/91, pulse 67, temperature 98.2 F (36.8 C), temperature source Oral, resp. rate 17, height 5\' 11"  (1.803 m), weight 131.543 kg (290 lb), SpO2 100 %.  PHYSICAL EXAMINATION:  GENERAL:  55 y.o.-year-old patient lying in  the bed with no acute distress.  EYES: Pupils equal, round, reactive to light and accommodation. No scleral icterus. Extraocular muscles intact.  HEENT: Head atraumatic, normocephalic. Oropharynx and nasopharynx clear.  NECK:  Supple, no jugular venous distention. No thyroid enlargement, no tenderness.  LUNGS: Normal breath sounds right lung, decreased breath sounds left lung base with rhonchi in the left lung base. No wheezing, rales, or crepitation. No use of accessory muscles of respiration.  CARDIOVASCULAR: S1, S2 normal. No murmurs, rubs, or gallops.  ABDOMEN: Soft, nontender, nondistended. Bowel sounds present. No organomegaly or mass.  EXTREMITIES: 2+ edema, no cyanosis, or clubbing.  NEUROLOGIC: Cranial nerves II through XII are intact. Decreased hearing. Muscle strength 5/5 in all extremities. Sensation intact. Gait not checked.  PSYCHIATRIC: The patient is alert and oriented x 3.  SKIN: No rash, lesion, or ulcer.   LABORATORY PANEL:   CBC  Recent Labs Lab 01/26/15 1553  WBC 6.4  HGB 11.9*  HCT 38.1  PLT 227   ------------------------------------------------------------------------------------------------------------------  Chemistries   Recent Labs Lab 01/26/15 1553  NA 139  K 4.2  CL 108  CO2 28  GLUCOSE 119*  BUN 15  CREATININE 0.91  CALCIUM 8.6*  AST 16  ALT 9*  ALKPHOS 137*  BILITOT 0.5   ------------------------------------------------------------------------------------------------------------------  Cardiac Enzymes  Recent Labs Lab 01/26/15 1553  TROPONINI <0.03   ------------------------------------------------------------------------------------------------------------------  RADIOLOGY:  Dg Chest 2 View  01/26/2015  CLINICAL DATA:  Altered mental status.  Intermittent cough. EXAM: CHEST  2 VIEW COMPARISON:  12/05/2014 FINDINGS: Patchy opacity in the left lung base, new from prior exam. Right lung is clear. Heart size and mediastinal contours  are unchanged, atherosclerosis noted of the aortic arch. No pulmonary edema, pleural effusion or pneumothorax. Mild compression deformity in the mid thoracic spine, unchanged. IMPRESSION: Patchy left basilar opacity, suspicious for pneumonia in the setting of cough. Electronically Signed   By: Rubye Oaks M.D.   On: 01/26/2015 20:37   Ct Head Wo Contrast  01/26/2015  CLINICAL DATA:  Increased confusion EXAM: CT HEAD WITHOUT CONTRAST TECHNIQUE: Contiguous axial images were obtained from the base of the skull through the vertex without intravenous contrast. COMPARISON:  None. FINDINGS: The bony calvarium is intact. Hyperostosis is noted and stable. Mucosal thickening is noted throughout the ethmoid maxillary and sphenoid sinuses. These have increased in the interval from the prior exam particularly in the left maxillary antrum. No evidence of acute hemorrhage, acute infarction or space-occupying mass lesion is noted. IMPRESSION: No acute intracranial abnormality is noted. Worsening sinus disease as described. Electronically Signed   By: Alcide Clever M.D.   On: 01/26/2015 17:16    EKG:   Not done  IMPRESSION AND PLAN:   1. Left lower lobe pneumonia with acute encephalopathy. Start IV Rocephin and oral Zithromax. Start nebulizer treatments. 2. 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 Continue oral medications 3.  Essential hypertension blood pressure borderline- continue her usual medications and monitor blood pressure 4. Hypothyroidism unspecified continue levothyroxine 5. Weakness we'll get physical therapy evaluation 6. Social Counsellorworker evaluation. The patient told me that her family does not want her back home again. We will have to look into placement options. I was unable to contact family at this point in time.  All the records are reviewed and case discussed with ED provider. Management plans discussed with the patient, and she is in agreement.  CODE  STATUS: Full code  TOTAL TIME TAKING CARE OF THIS PATIENT: 50 minutes.    Alford HighlandWIETING, Jeraldine Primeau M.D on 01/26/2015 at 9:14 PM  Between 7am to 6pm - Pager - [phone]  After 6pm call admission pager [phone]  [facility]  Office  [phone]  CC: Primary care physician; Derwood KaplanEason,  Ernest B, MD

## 2015-01-26 NOTE — ED Notes (Signed)
Pt fell 3 days ago and had been getting worse since. Family reports an open wound on left foot per family that is warm to the touch.  She has been not eating and drinking. Has been getting combative and confused as well.  They are worried about infection.

## 2015-01-26 NOTE — ED Notes (Signed)
Per family 3 days ago pt became combative and confused, reports shes not eating and has been falling a lot. Pt has pitting edema in both legs with an ulcer on L.leg that has been healing per family. Hx of diabetes.  Family reports pt has been like this before when she had a UTI 5 weeks ago

## 2015-01-27 ENCOUNTER — Encounter: Payer: Self-pay | Admitting: *Deleted

## 2015-01-27 LAB — BASIC METABOLIC PANEL
ANION GAP: 4 — AB (ref 5–15)
BUN: 17 mg/dL (ref 6–20)
CO2: 27 mmol/L (ref 22–32)
Calcium: 8.2 mg/dL — ABNORMAL LOW (ref 8.9–10.3)
Chloride: 110 mmol/L (ref 101–111)
Creatinine, Ser: 0.93 mg/dL (ref 0.44–1.00)
GFR calc Af Amer: >60 mL/min (ref >60)
GLUCOSE: 131 mg/dL — AB (ref 65–99)
POTASSIUM: 4 mmol/L (ref 3.5–5.1)
SODIUM: 141 mmol/L (ref 135–145)

## 2015-01-27 LAB — CBC
HEMATOCRIT: 34.3 % — AB (ref 35.0–47.0)
HEMOGLOBIN: 10.6 g/dL — AB (ref 12.0–16.0)
MCH: 22.5 pg — ABNORMAL LOW (ref 26.0–34.0)
MCHC: 31.1 g/dL — ABNORMAL LOW (ref 32.0–36.0)
MCV: 72.4 fL — ABNORMAL LOW (ref 80.0–100.0)
Platelets: 209 10*3/uL (ref 150–440)
RBC: 4.74 MIL/uL (ref 3.80–5.20)
RDW: 16 % — ABNORMAL HIGH (ref 11.5–14.5)
WBC: 4.9 10*3/uL (ref 3.6–11.0)

## 2015-01-27 LAB — GLUCOSE, CAPILLARY
GLUCOSE-CAPILLARY: 102 mg/dL — AB (ref 65–99)
GLUCOSE-CAPILLARY: 130 mg/dL — AB (ref 65–99)
GLUCOSE-CAPILLARY: 127 mg/dL — AB (ref 65–99)
Glucose-Capillary: 104 mg/dL — ABNORMAL HIGH (ref 65–99)
Glucose-Capillary: 107 mg/dL — ABNORMAL HIGH (ref 65–99)

## 2015-01-27 MED ORDER — IPRATROPIUM-ALBUTEROL 0.5-2.5 (3) MG/3ML IN SOLN
3.0000 mL | Freq: Four times a day (QID) | RESPIRATORY_TRACT | Status: DC
  Administered 2015-01-28: 3 mL via RESPIRATORY_TRACT
  Filled 2015-01-27: qty 3

## 2015-01-27 MED ORDER — ENOXAPARIN SODIUM 40 MG/0.4ML ~~LOC~~ SOLN
40.0000 mg | Freq: Two times a day (BID) | SUBCUTANEOUS | Status: DC
  Administered 2015-01-27: 40 mg via SUBCUTANEOUS
  Filled 2015-01-27: qty 0.4

## 2015-01-27 NOTE — Evaluation (Signed)
Physical Therapy Evaluation Patient Details Name: Kirsten Young MRN: [id number] DOB: [birth year] Today's Date:    History of Present Illness  presented to ER secondary to AMS, fall and L LE ulceration; admitted with L LL PNA with acute encephalopathy.  Of note, patient with significant visual and hearing deficits at baseline.  Clinical Impression  Upon evaluation, patient alert and oriented to self, location only; generally confused to more complex information. Significant visual deficits, hearing deficits noted at baseline.  Bilat UE/LE strength and ROM generally deconditioned, but functional for basic transfers and mobility (3/5).  LEs rather tremulous with very poor balance reactions; anticipate high risk of buckling.  Currently requiring min/mod assist +2 with RW for all mobility (sit/stand, basic transfers and gait x25'); very unsteady and weak in upright positions.  Extensive assist for walker position/management; absent functional reach outside immediate BOS.  Very high fall risk; unsafe to attempt without +2 assist at this time. Would benefit from skilled PT to address above deficits and promote optimal return to PLOF; recommend transition to STR upon discharge from acute hospitalization.     Follow Up Recommendations SNF    Equipment Recommendations       Recommendations for Other Services       Precautions / Restrictions Precautions Precautions: Fall Restrictions Weight Bearing Restrictions: No      Mobility  Bed Mobility Overal bed mobility: Modified Independent                Transfers Overall transfer level: Needs assistance Equipment used: Rolling walker (2 wheeled) Transfers: Sit to/from Stand Sit to Stand: Min assist;Mod assist;+2 physical assistance            Ambulation/Gait Ambulation/Gait assistance: Min assist;Mod assist;+2 physical assistance Ambulation Distance (Feet): 20 Feet Assistive device: Rolling walker (2 wheeled)        General Gait Details: very short, choppy steps with flat foot contact, broad BOS.  Heavy WBing bilat UEs on RW, extensive assist for walker position/management (visual impairment vs. confusion?).  Poor dynamic balance, extremely high fall risk.  Unsafe to attempt without +2 at this time.  Stairs            Wheelchair Mobility    Modified Rankin (Stroke Patients Only)       Balance Overall balance assessment: Needs assistance Sitting-balance support: No upper extremity supported;Feet supported Sitting balance-Leahy Scale: Good     Standing balance support: Bilateral upper extremity supported Standing balance-Leahy Scale: Poor                 High Level Balance Comments: absent functional reach outside immediate BOS             Pertinent Vitals/Pain Pain Assessment: No/denies pain    Home Living Family/patient expects to be discharged to:: Private residence Living Arrangements: Children Available Help at Discharge: Family (daughter works outside of home; unavailable for 24 hour sup/assist) Type of Home: House         Home Equipment: Environmental consultant - 2 wheels Additional Comments: Patient able to provide very limited social history; will verify with family as available    Prior Function Level of Independence: Independent with assistive device(s)         Comments: Patient unable to provide extensive information, but reports household mobilization/activity with RW prior to admission     Hand Dominance        Extremity/Trunk Assessment   Upper Extremity Assessment: Overall WFL for tasks assessed  Lower Extremity Assessment: Generalized weakness (grossly 3/5 throughout)         Communication   Communication: HOH  Cognition Arousal/Alertness: Awake/alert Behavior During Therapy: WFL for tasks assessed/performed Overall Cognitive Status: No family/caregiver present to determine baseline cognitive functioning (oriented to self, location  only; very poor safety awareness/insight.  follows simple commands, though [facility]                      General Comments      Exercises        Assessment/Plan    PT Assessment Patient needs continued PT services  PT Diagnosis Difficulty walking;Generalized weakness   PT Problem List Decreased strength;Decreased activity tolerance;Decreased balance;Decreased mobility;Decreased coordination;Decreased cognition;Decreased knowledge of use of DME;Decreased safety awareness;Decreased knowledge of precautions;Decreased skin integrity  PT Treatment Interventions DME instruction;Gait training;Stair training;Functional mobility training;Therapeutic activities;Therapeutic exercise;Balance training;Cognitive remediation;Patient/family education   PT Goals (Current goals can be found in the Care Plan section) Acute Rehab PT Goals PT Goal Formulation: Patient unable to participate in goal setting Time For Goal Achievement: 02/10/15 Potential to Achieve Goals: Fair    Frequency Min 2X/week   Barriers to discharge Inaccessible home environment;Decreased caregiver support      Co-evaluation               End of Session Equipment Utilized During Treatment: Gait belt Activity Tolerance: Patient tolerated treatment well Patient left: in chair;with call bell/phone within reach;with chair alarm set Nurse Communication: Mobility status (+2 for return to bed)         Time: [id number] PT Time Calculation (min) (ACUTE ONLY): 15 min   Charges:   PT Evaluation $PT Eval Moderate Complexity: 1 Procedure PT Treatments $Gait Training: 8-22 mins   PT G Codes:        Adriano Bischof H. Manson PasseyBrown, PT, DPT, NCS , 10:08 AM [phone]

## 2015-01-27 NOTE — Clinical Social Work Note (Signed)
Clinical Social Work Assessment  Patient Details  Name: Kirsten Young MRN: [id number] Date of Birth: 02/14/1960  Date of referral:                 Reason for consult:  Facility Placement                Permission sought to share information with:  Facility Medical sales representativeContact Representative, Family Supports Permission granted to share information::  Yes, Verbal Permission Granted  Name::        Agency::     Relationship::     Contact Information:     Housing/Transportation Living arrangements for the past 2 months:  Single Family Home Source of Information:  Patient, Adult Children Patient Interpreter Needed:  None Criminal Activity/Legal Involvement Pertinent to Current Situation/Hospitalization:  No - Comment as needed Significant Relationships:  Adult Children, Siblings Lives with:  Adult Children Do you feel safe going back to the place where you live?    Need for family participation in patient care:     Care giving concerns:  Patient resides with her daughter: Kirsten Young: [phone].    Social Worker assessment / plan:  PT has recommended rehab at discharge. CSW spoke with patient who is very hard of hearing and blind and she informed CSW that [facility]. She [facility]. She is agreeable to rehab at this time and has given me permission to call her daughters. CSW attempted to call Kirsten Young but had to leave a message. CSW contacted Kirsten Young, her other daughter: [phone] and Ms. Kirsten Young stated that patient was living in an apartment a couple of years ago independently and then went to live with her for a year and then went to live with her brother because she started school and now is living with Kirsten Young who has started back to school. Kirsten Young stated that they are wanting rehab for patient with the possibility that it may turn into long term. Kirsten Young stated that they prefer [facility] if possible. [facility]   Employment status:  Disabled (Comment on whether or not currently receiving Disability) Insurance information:  Medicare PT Recommendations:  Skilled Nursing Facility Information / Referral to community resources:  Skilled Nursing Facility  Patient/Family's Response to care:  Patient and daughter expressed appreciation for CSW assistance.  Patient/Family's Understanding of and Emotional Response to Diagnosis, Current Treatment, and Prognosis:  Patient is amenable to rehab at this time.   Emotional Assessment Appearance:  Appears older than stated age Attitude/Demeanor/Rapport:   (pleasant and cooperative) Affect (typically observed):  Accepting, Adaptable, Calm Orientation:    Alcohol / Substance use:  Not Applicable Psych involvement (Current and /or in the community):  No (Comment)  Discharge Needs  Concerns to be addressed:  Care Coordination Readmission within the last 30 days:  No Current discharge risk:  None Barriers to Discharge:  No Barriers Identified   [name], LCSW , 3:19 PM

## 2015-01-27 NOTE — [place] FL2 (Signed)
[place] MEDICAID FL2 LEVEL OF CARE SCREENING TOOL     IDENTIFICATION  Patient Name: Kirsten Young Birthdate: April 11, 1960 Sex: female Admission Date (Current Location): 01/26/2015  [place]   Facility and Address:  [facility], [address], [place], [place] [address]      Provider Number: [id number]  Attending Physician Name and Address:  Enedina FinnerSona Patel, MD  Relative Name and Phone Number:       Current Level of Care: Hospital Recommended Level of Care: Skilled Nursing Facility Prior Approval Number:    Date Approved/Denied:   PASRR Number: [id number]  Discharge Plan: SNF    Current Diagnoses: Patient Active Problem List   Diagnosis Date Noted  . Pneumonia 01/26/2015  . Major depressive disorder, recurrent episode, severe, with psychotic behavior (HCC) 03/12/2014  . Chest pain due to psychological stress   . Altered mental state 12/19/2013  . Hypothyroidism 12/19/2013  . Hypertension 12/19/2013  . Diabetes mellitus without complication (HCC) 12/19/2013  . Depression 12/19/2013  . Fever 12/19/2013  . Acute encephalopathy 12/19/2013  . Acute sinusitis 12/19/2013    Orientation RESPIRATION BLADDER Height & Weight    Self    Continent   241 lbs.  BEHAVIORAL SYMPTOMS/MOOD NEUROLOGICAL BOWEL NUTRITION STATUS   (none)  (none) Continent    AMBULATORY STATUS COMMUNICATION OF NEEDS Skin     Verbally Normal                       Personal Care Assistance Level of Assistance              Functional Limitations Info  Sight, Hearing Sight Info: Impaired Hearing Info: Impaired      SPECIAL CARE FACTORS FREQUENCY                       Contractures Contractures Info: Not present    Additional Factors Info  Code Status, Allergies Code Status Info: Full Allergies Info: NKA           Current Medications ():  This is the current hospital active medication list Current  Facility-Administered Medications  Medication Dose Route Frequency Provider Last Rate Last Dose  . 0.9 %  sodium chloride infusion   Intravenous Continuous Alford Highlandichard Wieting, MD 50 mL/hr at  0031    . acetaminophen (TYLENOL) tablet 650 mg  650 mg Oral Q6H PRN Alford Highlandichard Wieting, MD       Or  . acetaminophen (TYLENOL) suppository 650 mg  650 mg Rectal Q6H PRN Alford Highlandichard Wieting, MD      . atenolol (TENORMIN) tablet 50 mg  50 mg Oral Daily Alford Highlandichard Wieting, MD   Stopped at  1000  . azithromycin (ZITHROMAX) tablet 250 mg  250 mg Oral Daily Alford Highlandichard Wieting, MD   250 mg at  0805  . cefTRIAXone (ROCEPHIN) 1 g in dextrose 5 % 50 mL IVPB  1 g Intravenous Q24H Alford Highlandichard Wieting, MD   1 g at  0233  . enoxaparin (LOVENOX) injection 40 mg  40 mg Subcutaneous BID Cindi CarbonMary M Swayne, [facility]PH      . insulin aspart (novoLOG) injection 0-5 Units  0-5 Units Subcutaneous QHS Alford Highlandichard Wieting, MD   0 Units at  0031  . insulin aspart (novoLOG) injection 0-9 Units  0-9 Units Subcutaneous TID WC Alford Highlandichard Wieting, MD   1 Units at  1203  . ipratropium-albuterol (DUONEB) 0.5-2.5 (3) MG/3ML nebulizer solution 3 mL  3 mL  Nebulization Q6H Alford Highland, MD   3 mL at  1455  . levothyroxine (SYNTHROID, LEVOTHROID) tablet 125 mcg  125 mcg Oral QAC breakfast Alford Highland, MD   125 mcg at  0805  . lisinopril (PRINIVIL,ZESTRIL) tablet 5 mg  5 mg Oral Daily Alford Highland, MD   Stopped at  1000  . metFORMIN (GLUCOPHAGE) tablet 500 mg  500 mg Oral BID WC Alford Highland, MD   500 mg at  0805  . risperiDONE (RISPERDAL M-TABS) disintegrating tablet 1 mg  1 mg Oral QHS Alford Highland, MD   1 mg at  0046     Discharge Medications: Please see discharge summary for a list of discharge medications.  Relevant Imaging Results:  Relevant Lab Results:   Additional Information    York Spaniel, LCSW

## 2015-01-27 NOTE — Progress Notes (Addendum)
[facility]   PATIENT NAME: Kirsten Young    MR#:  [id number]  DATE OF BIRTH:  04/28/1960  SUBJECTIVE:  Visual and hearing impairment. Doing ok. Some cough  REVIEW OF SYSTEMS:   Review of Systems  Constitutional: Negative for fever, chills and weight loss.  HENT: Negative for ear discharge, ear pain and nosebleeds.   Eyes: Negative for blurred vision, pain and discharge.  Respiratory: Positive for cough. Negative for sputum production, shortness of breath, wheezing and stridor.   Cardiovascular: Negative for chest pain, palpitations, orthopnea and PND.  Gastrointestinal: Negative for nausea, vomiting, abdominal pain and diarrhea.  Genitourinary: Negative for urgency and frequency.  Musculoskeletal: Negative for back pain and joint pain.  Neurological: Positive for weakness. Negative for sensory change, speech change and focal weakness.  Psychiatric/Behavioral: Negative for depression and hallucinations. The patient is not nervous/anxious.   All other systems reviewed and are negative.  Tolerating Diet:yes Tolerating PT: recommends rehab  DRUG ALLERGIES:  No Known Allergies  VITALS:  Blood pressure 116/66, pulse 74, temperature 98.2 F (36.8 C), temperature source Oral, resp. rate 17, height 5\' 11"  (1.803 m), weight 109.498 kg (241 lb 6.4 oz), SpO2 99 %.  PHYSICAL EXAMINATION:   Physical Exam  GENERAL:  55 y.o.-year-old patient lying in the bed with no acute distress.  EYES: Pupils equal, round, reactive to light and accommodation. No scleral icterus. Extraocular muscles intact.  HEENT: Head atraumatic, normocephalic. Oropharynx and nasopharynx clear.  NECK:  Supple, no jugular venous distention. No thyroid enlargement, no tenderness.  LUNGS: Normal breath sounds bilaterally, no wheezing, rales, rhonchi. No use of accessory muscles of respiration.  CARDIOVASCULAR: S1, S2 normal. No murmurs, rubs, or gallops.  ABDOMEN: Soft,  nontender, nondistended. Bowel sounds present. No organomegaly or mass.  EXTREMITIES: No cyanosis, clubbing or edema b/l.    NEUROLOGIC: Cranial nerves II through XII are intact. No focal Motor or sensory deficits b/l.   PSYCHIATRIC: The patient is alert and oriented x 3.  SKIN: No obvious rash, lesion, or ulcer.   LABORATORY PANEL:  CBC  Recent Labs Lab  0441  WBC 4.9  HGB 10.6*  HCT 34.3*  PLT 209    Chemistries   Recent Labs Lab 01/26/15 1553  0441  NA 139 141  K 4.2 4.0  CL 108 110  CO2 28 27  GLUCOSE 119* 131*  BUN 15 17  CREATININE 0.91 0.93  CALCIUM 8.6* 8.2*  AST 16  --   ALT 9*  --   ALKPHOS 137*  --   BILITOT 0.5  --     Cardiac Enzymes  Recent Labs Lab 01/26/15 1553  TROPONINI <0.03    RADIOLOGY:  Dg Chest 2 View  01/26/2015  CLINICAL DATA:  Altered mental status.  Intermittent cough. EXAM: CHEST  2 VIEW COMPARISON:  12/05/2014 FINDINGS: Patchy opacity in the left lung base, new from prior exam. Right lung is clear. Heart size and mediastinal contours are unchanged, atherosclerosis noted of the aortic arch. No pulmonary edema, pleural effusion or pneumothorax. Mild compression deformity in the mid thoracic spine, unchanged. IMPRESSION: Patchy left basilar opacity, suspicious for pneumonia in the setting of cough. Electronically Signed   By: Rubye OaksMelanie  Ehinger M.D.   On: 01/26/2015 20:37   Ct Head Wo Contrast  01/26/2015  CLINICAL DATA:  Increased confusion EXAM: CT HEAD WITHOUT CONTRAST TECHNIQUE: Contiguous axial images were obtained from the base of the skull through the vertex without intravenous contrast.  COMPARISON:  None. FINDINGS: The bony calvarium is intact. Hyperostosis is noted and stable. Mucosal thickening is noted throughout the ethmoid maxillary and sphenoid sinuses. These have increased in the interval from the prior exam particularly in the left maxillary antrum. No evidence of acute hemorrhage, acute infarction or  space-occupying mass lesion is noted. IMPRESSION: No acute intracranial abnormality is noted. Worsening sinus disease as described. Electronically Signed   By: Alcide Clever M.D.   On: 01/26/2015 17:16    ASSESSMENT AND PLAN:  Kirsten Young is a 55 y.o. female brought in by family for altered mental status and a fall today. Patient states that she had a fall today and hit her neck on the wall. In the ER, a chest x-ray showed a left lower lobe pneumonia and hospitalist services were contacted for further evaluation.   1. Left lower lobe pneumonia with acute encephalopathy. - IV Rocephin and oral Zithromax. Start nebulizer treatments. 2. 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 Continue oral medications 3. Essential hypertension blood pressure borderline- continue her usual medications and monitor blood pressure 4. Hypothyroidism unspecified continue levothyroxine 5. Weakness we'll get physical therapy evaluation 6. Social Counsellor. For d/c planning  Case discussed with Care Management/Social Worker. Management plans discussed with the patient, family and they are in agreement.  CODE STATUS: full  DVT Prophylaxis: lovneox  TOTAL TIME TAKING CARE OF THIS PATIENT: 30 minutes.  >50% time spent on counselling and coordination of care  POSSIBLE D/C IN 1-2 DAYS, DEPENDING ON CLINICAL CONDITION.  Note: This dictation was prepared with Dragon dictation along with smaller phrase technology. Any transcriptional errors that result from this process are unintentional.  Lexee Brashears M.D on  at 5:08 PM  Between 7am to 6pm - Pager - [phone]  After 6pm go to [email or web address] - password [email or web address] [facility]  [facility] [place]  Office  [phone]  CC: Primary care physician; Derwood Kaplan, MD

## 2015-01-27 NOTE — Progress Notes (Signed)
Anticoagulation monitoring(Lovenox):  55 year old female ordered enoxaparin 40 mg q 24 hours for DVT prophylaxis.  Filed Weights   01/26/15 1516  0003  Weight: 290 lb (131.543 kg) 241 lb 6.4 oz (109.498 kg)   BMI 40.5  Lab Results  Component Value Date   CREATININE 0.93    CREATININE 0.91 01/26/2015   CREATININE 1.02* 12/05/2014   Estimated Creatinine Clearance: 96.4 mL/min (by C-G formula based on Cr of 0.93). Hemoglobin & Hematocrit     Component Value Date/Time   HGB 10.6*  0441   HGB 10.0* 12/07/2013 0519   HCT 34.3*  0441   HCT 32.2* 12/07/2013 [id number]   Per Protocol for Patient with estCrcl > 30 ml/min and BMI > 40, will transition to Lovenox 40 mg q 12 hours.  Cindi CarbonMary M Bryston Colocho, PharmD Clinical Pharmacist 10:14 AM

## 2015-01-28 LAB — GLUCOSE, CAPILLARY
Glucose-Capillary: 89 mg/dL (ref 65–99)
Glucose-Capillary: 128 mg/dL — ABNORMAL HIGH (ref 65–99)
Glucose-Capillary: 75 mg/dL (ref 65–99)
Glucose-Capillary: 98 mg/dL (ref 65–99)

## 2015-01-28 MED ORDER — AMOXICILLIN-POT CLAVULANATE 875-125 MG PO TABS: 1.0000 | ORAL_TABLET | Freq: Two times a day (BID) | ORAL | Status: DC

## 2015-01-28 MED ORDER — IPRATROPIUM-ALBUTEROL 0.5-2.5 (3) MG/3ML IN SOLN: 3.0000 mL | Freq: Four times a day (QID) | RESPIRATORY_TRACT | Status: DC | PRN

## 2015-01-28 MED ORDER — ENOXAPARIN SODIUM 40 MG/0.4ML ~~LOC~~ SOLN
40.0000 mg | SUBCUTANEOUS | Status: DC
  Administered 2015-01-28 – 2015-01-30 (×3): 40 mg via SUBCUTANEOUS
  Filled 2015-01-28 (×3): qty 0.4

## 2015-01-28 MED ORDER — AMOXICILLIN-POT CLAVULANATE 875-125 MG PO TABS
1.0000 | ORAL_TABLET | Freq: Two times a day (BID) | ORAL | Status: DC
  Administered 2015-01-28 – 2015-01-31 (×7): 1 via ORAL
  Filled 2015-01-28 (×7): qty 1

## 2015-01-28 NOTE — Progress Notes (Signed)
Physical Therapy Treatment Patient Details Name: Kirsten Young MRN: [id number] DOB: October 27, 1960 Today's Date: 01/28/2015    History of Present Illness presented to ER secondary to AMS, fall and L LE ulceration; admitted with L LL PNA with acute encephalopathy.  Of note, patient with significant visual and hearing deficits at baseline.    PT Comments    Patient sleeping upon arrival to room ;arousable, but slightly agitated at times.  Very impulsive and unaware of safety needs.  Requiring consistent mod assist +2 for all mobility this date; generally unsteady and unsafe with several LOB requiring assist to recover.  Additional gait distance deferred as result.   Follow Up Recommendations  SNF     Equipment Recommendations       Recommendations for Other Services       Precautions / Restrictions Precautions Precautions: Fall Restrictions Weight Bearing Restrictions: No    Mobility  Bed Mobility Overal bed mobility: Modified Independent                Transfers Overall transfer level: Needs assistance Equipment used: Rolling walker (2 wheeled) Transfers: Sit to/from Stand Sit to Stand: Mod assist;+2 physical assistance         General transfer comment: increased retropulsion with initial movement transition; mod +2 to maintain static balance  Ambulation/Gait Ambulation/Gait assistance: Mod assist;+2 physical assistance Ambulation Distance (Feet): 4 Feet Assistive device: Rolling walker (2 wheeled)       General Gait Details: constant cuing/assist for walker management, dynamic balance and task instruction (due to visual impairment).  Patient very unsteady/unsafe; very high fall risk   Stairs            Wheelchair Mobility    Modified Rankin (Stroke Patients Only)       Balance   Sitting-balance support: No upper extremity supported;Feet supported Sitting balance-Leahy Scale: Good     Standing balance support: Bilateral upper extremity  supported Standing balance-Leahy Scale: Poor                      Cognition Arousal/Alertness: Awake/alert Behavior During Therapy: Agitated Overall Cognitive Status: No family/caregiver present to determine baseline cognitive functioning                      Exercises Other Exercises Other Exercises: Toilet transfer, SPT with RW, mod assist +2 for lift off, forward weight translation and standing balance throughout task.  Additional sit/stand x2 with RW, mod assist +2 for hygiene (dep).    General Comments        Pertinent Vitals/Pain Pain Assessment: No/denies pain    Home Living                      Prior Function            PT Goals (current goals can now be found in the care plan section) Acute Rehab PT Goals PT Goal Formulation: Patient unable to participate in goal setting Time For Goal Achievement: 02/10/15 Potential to Achieve Goals: Fair Progress towards PT goals: Not progressing toward goals - comment (slightly agitated this date, +2 for all mobility efforts)    Frequency  Min 2X/week    PT Plan Current plan remains appropriate    Co-evaluation             End of Session Equipment Utilized During Treatment: Gait belt Activity Tolerance: Patient tolerated treatment well Patient left: in bed;with call bell/phone within reach;with bed alarm  set     Time: 1040-1059 PT Time Calculation (min) (ACUTE ONLY): 19 min  Charges:  $Therapeutic Activity: 8-22 mins                    G Codes:       [place] Dona H. Manson Passey, PT, DPT, NCS 01/28/2015, 1:23 PM [phone]

## 2015-01-28 NOTE — Progress Notes (Signed)
[facility] to [facility] bed [facility] for patient. Per [name], admissions coordinator at [facility]. She reports that [facility] unless she pays $5,000 before admissions. CSW contacted [name], admissions coordiantor from [facility], [name], admissions coordinator from [facility], and [place], admissions coordiantor from [facility] they are not able to offer patient a bed for STR. CSW was informed by MD that patient is medically stable to discharge. Patient did not meet the 3 night inpatient hospital admission requirement for Medicare STR placement. MD has discharged patient home today. RN case manager is arranging HH and is aware of above.   CSW contacted patient's daughter Kirsten Young (ext [phone]). She was in a procedure. Left CSW contact information. CSW also called [name]'s cell phone [phone] and left a voicemail. Awaiting phone call back.   CSW contacted patient's daughter Kirsten Young [phone]. CSW left voicemail. Awaiting phone call back.   CSW will continue to assist and follow.   Woodroe Modehristina Zalyn Amend, MSW, LCSW-A Clinical Social Work Department [phone]

## 2015-01-28 NOTE — Progress Notes (Signed)
CSW received a phone call from patient's daughter Lindaann Pascalomika [phone] over the telephone. CSW informed patient's daughter that MD has discharged patient home today; explained that patient did not meet the 3 night inpatient hospital admission requirement for Medicare STR placement; and explained [facility] that [facility]. Per patient's daughter she wants to discuss HH with her sister. She reports that she'll call CSW back.  CSW contacted Medical illustratorN Care Manager and informed her of above. She reports she will coordinate [facility] if needed. There are no social work needs identified at this time. CSW is available if a need were to arise.   Woodroe Modehristina Lexxie Winberg, MSW, LCSW-A Clinical Social Work Department [phone]

## 2015-01-28 NOTE — Care Management (Signed)
Patient daughter Kirsten Young at bedside.  Family is filing and appeal .  Family has contacted [facility]. Detailed Notice of Discharge and IM signed by Daughter

## 2015-01-28 NOTE — Care Management Important Message (Signed)
Important Message  Patient Details  Name: Kirsten ChessmanMary E Kutch MRN: [id number] Date of Birth: 08/18/1960   Medicare Important Message Given:  Yes    Olegario MessierKathy A Masson Nalepa 01/28/2015, 10:23 AM

## 2015-01-28 NOTE — Progress Notes (Signed)
Pt.'s breathing treatments were changed to Q6 prn. Her score on the assessment protocol was 5.

## 2015-01-28 NOTE — Progress Notes (Signed)
Pt seen attempting to get up, slid to floor, md notified, supervisor notified, message left for family, Pt alert and oriented confused at times, sitting up in chairPt states not hurt, no apparent injury seen

## 2015-01-28 NOTE — Progress Notes (Signed)
Anticoagulation monitoring(Lovenox):  55 yo  ordered Lovenox 40 mg Q12h  Filed Weights   01/26/15 1516  0003  Weight: 290 lb (131.543 kg) 241 lb 6.4 oz (109.498 kg)   BMI 39  Lab Results  Component Value Date   CREATININE 0.93    CREATININE 0.91 01/26/2015   CREATININE 1.02* 12/05/2014   Estimated Creatinine Clearance: 88.7 mL/min (by C-G formula based on Cr of 0.93). Hemoglobin & Hematocrit     Component Value Date/Time   HGB 10.6*  0441   HGB 10.0* 12/07/2013 0519   HCT 34.3*  0441   HCT 32.2* 12/07/2013 [id number]     Per Protocol for Patient with estCrcl > 30 ml/min and BMI < 40, will transition to Lovenox 40 mg Q24h.  Jodelle RedMary M Branden Shallenberger 9:43 AM

## 2015-01-28 NOTE — Discharge Summary (Signed)
[facility] [place] at [facility]   PATIENT NAME: Kirsten ConchMary Young    MR#:  [id number]  DATE OF BIRTH:  02/03/1960  DATE OF ADMISSION:  01/26/2015 ADMITTING PHYSICIAN: Alford Highlandichard Wieting, MD  DATE OF DISCHARGE: 01/28/15  PRIMARY CARE PHYSICIAN: Kirsten KaplanEason,  Kirsten B, MD    ADMISSION DIAGNOSIS:  fall not eating confused chest pains  DISCHARGE DIAGNOSIS:  pneumonia disability due to visual and hearing impairment HTN DM-2 SECONDARY DIAGNOSIS:   Past Medical History  Diagnosis Date  . Renal disorder   . Hypertension   . Diabetes mellitus without complication (HCC)   . Thyroid disease     HOSPITAL COURSE:  Kirsten Young is a 55 y.o. female brought in by family for altered mental status and a fall today. Patient states that she had a fall today and hit her neck on the wall. In the ER, a chest x-ray showed a left lower lobe pneumonia and hospitalist services were contacted for further evaluation.  1. Left lower lobe pneumonia with acute encephalopathy.improved. Mentation appropriate - IV Rocephin and oral Zithromax.--->change to po augmentin -wbc normal. No cough or fever.  -prn nebulizer treatments.  2. 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 Continue oral medications  3. Essential hypertension blood pressure borderline- continue her usual medications and monitor blood pressure  4. Hypothyroidism unspecified continue levothyroxine  5. Weakness  physical therapy evaluation recommends rehab but pt owes ton of money to nursing facitliy. Will have to d/c her home with family [facility]  6. D/c discharge planners  Medically stable for d/c  CONSULTS OBTAINED:  Treatment Team:  Alford Highlandichard Wieting, MD  DRUG ALLERGIES:  No Known Allergies  DISCHARGE MEDICATIONS:   Current Discharge Medication List    START taking these medications   Details  amoxicillin-clavulanate (AUGMENTIN) 875-125 MG tablet Take 1 tablet by mouth  every 12 (twelve) hours. Qty: 10 tablet, Refills: 0      CONTINUE these medications which have NOT CHANGED   Details  acetaminophen (TYLENOL) 325 MG tablet Take 650 mg by mouth every 6 (six) hours as needed for moderate pain or headache.    atenolol (TENORMIN) 50 MG tablet Take 50 mg by mouth daily.    furosemide (LASIX) 40 MG tablet Take 40 mg by mouth daily.    glimepiride (AMARYL) 4 MG tablet Take 4 mg by mouth daily with breakfast.    levothyroxine (SYNTHROID, LEVOTHROID) 125 MCG tablet Take 125 mcg by mouth daily before breakfast.    lisinopril (PRINIVIL,ZESTRIL) 5 MG tablet Take 5 mg by mouth daily.    metFORMIN (GLUCOPHAGE) 500 MG tablet Take by mouth 2 (two) times daily with a meal.    potassium chloride SA (K-DUR,KLOR-CON) 20 MEQ tablet Take 20 mEq by mouth daily.    risperiDONE (RISPERDAL M-TABS) 1 MG disintegrating tablet Take 1 tablet (1 mg total) by mouth at bedtime. Qty: 30 tablet, Refills: 0      STOP taking these medications     ciprofloxacin (CIPRO) 250 MG tablet         If you experience worsening of your admission symptoms, develop shortness of breath, life threatening emergency, suicidal or homicidal thoughts you must seek medical attention immediately by calling 911 or calling your MD immediately  if symptoms less severe.  You Must read complete instructions/literature along with all the possible adverse reactions/side effects for all the Medicines you take and that have been prescribed to you. Take any new Medicines  after you have completely understood and accept all the possible adverse reactions/side effects.   Please note  You were cared for by a hospitalist during your hospital stay. If you have any questions about your discharge medications or the care you received while you were in the hospital after you are discharged, you can call the unit and asked to speak with the hospitalist on call if the hospitalist that took care of you is not available.  Once you are discharged, your primary care physician will handle any further medical issues. Please note that NO REFILLS for any discharge medications will be authorized once you are discharged, as it is imperative that you return to your primary care physician (or establish a relationship with a primary care physician if you do not have one) for your aftercare needs so that they can reassess your need for medications and monitor your lab values. Today   SUBJECTIVE   No complaints. Eating well. Pt RN no acute issues  VITAL SIGNS:  Blood pressure 99/44, pulse 65, temperature 97.9 F (36.6 C), temperature source Oral, resp. rate 16, height 5\' 6"  (1.676 m), weight 109.498 kg (241 lb 6.4 oz), SpO2 94 %.  I/O:   Intake/Output Summary (Last 24 hours) at 01/28/15 1033 Last data filed at 01/28/15 0915  Gross per 24 hour  Intake   1227 ml  Output    900 ml  Net    327 ml    PHYSICAL EXAMINATION:  GENERAL:  55 y.o.-year-old patient lying in the bed with no acute distress.  EYES: Pupils equal, round, reactive to light and accommodation. No scleral icterus. Extraocular muscles intact.  HEENT: Head atraumatic, normocephalic. Oropharynx and nasopharynx clear.  NECK:  Supple, no jugular venous distention. No thyroid enlargement, no tenderness.  LUNGS: Normal breath sounds bilaterally, no wheezing, rales,rhonchi or crepitation. No use of accessory muscles of respiration.  CARDIOVASCULAR: S1, S2 normal. No murmurs, rubs, or gallops.  ABDOMEN: Soft, non-tender, non-distended. Bowel sounds present. No organomegaly or mass.  EXTREMITIES: No pedal edema, cyanosis, or clubbing.  NEUROLOGIC: Cranial nerves II through XII are intact. Muscle strength 5/5 in all extremities. Sensation intact. Gait not checked.  PSYCHIATRIC: The patient is alert and oriented x 3.  SKIN: No obvious rash, lesion, or ulcer.   DATA REVIEW:   CBC   Recent Labs Lab  0441  WBC 4.9  HGB 10.6*  HCT 34.3*  PLT 209     Chemistries   Recent Labs Lab 01/26/15 1553  0441  NA 139 141  K 4.2 4.0  CL 108 110  CO2 28 27  GLUCOSE 119* 131*  BUN 15 17  CREATININE 0.91 0.93  CALCIUM 8.6* 8.2*  AST 16  --   ALT 9*  --   ALKPHOS 137*  --   BILITOT 0.5  --     Microbiology Results   No results found for this or any previous visit (from the past 240 hour(s)).  RADIOLOGY:  Dg Chest 2 View  01/26/2015  CLINICAL DATA:  Altered mental status.  Intermittent cough. EXAM: CHEST  2 VIEW COMPARISON:  12/05/2014 FINDINGS: Patchy opacity in the left lung base, new from prior exam. Right lung is clear. Heart size and mediastinal contours are unchanged, atherosclerosis noted of the aortic arch. No pulmonary edema, pleural effusion or pneumothorax. Mild compression deformity in the mid thoracic spine, unchanged. IMPRESSION: Patchy left basilar opacity, suspicious for pneumonia in the setting of cough. Electronically Signed   By: Rubye Oaks  M.D.   On: 01/26/2015 20:37   Ct Head Wo Contrast  01/26/2015  CLINICAL DATA:  Increased confusion EXAM: CT HEAD WITHOUT CONTRAST TECHNIQUE: Contiguous axial images were obtained from the base of the skull through the vertex without intravenous contrast. COMPARISON:  None. FINDINGS: The bony calvarium is intact. Hyperostosis is noted and stable. Mucosal thickening is noted throughout the ethmoid maxillary and sphenoid sinuses. These have increased in the interval from the prior exam particularly in the left maxillary antrum. No evidence of acute hemorrhage, acute infarction or space-occupying mass lesion is noted. IMPRESSION: No acute intracranial abnormality is noted. Worsening sinus disease as described. Electronically Signed   By: Alcide Clever M.D.   On: 01/26/2015 17:16     Management plans discussed with the patient, family and they are in agreement.  CODE STATUS:     Code Status Orders        Start     Ordered   01/26/15 2102  Full code   Continuous      01/26/15 2102    Code Status History    Date Active Date Inactive Code Status Order ID Comments User Context   03/12/2014 12:57 AM 03/15/2014  4:10 PM Full Code [id number]  Harle Battiest, NP ED   12/19/2013  2:48 AM 12/22/2013  4:45 PM Full Code [id number]  Lorretta Harp, MD Inpatient      TOTAL TIME TAKING CARE OF THIS PATIENT: 40 minutes.    Shaney Deckman M.D on 01/28/2015 at 10:33 AM  Between 7am to 6pm - Pager - [phone] After 6pm go to [email or web address] - password [email or web address] [facility]  [place] [facility]  Office  [phone]  CC: Primary care physician; Kirsten Kaplan, MD

## 2015-01-28 NOTE — Care Management (Signed)
Patient admitted from home with PNA and being treated with antibiotics.  Patient is blind and hard of hearing.  Patient lives at home with her daughter.  Daughter provides transportation.  Patient obtains her medications from [facility] on [address].  Patient has discharge orders in.  PT has recommended SNF.  Patient does not qualify for placement as she has not had a 3 night stay.  CSW has discussed with family. I spoke with  Daughter [name] who is an employee at [employer].  [name] expressed concerns of her mother being confused and not at baseline.  [name] does not feel that the patient is ready for discharge. While I was in the room I observed the patient talking to people that were not present, she stated she was talking through her brain waves.   I have made Dr. Allena KatzPatel aware of these concerns.  Dr. Allena KatzPatel states that there will be no additional work up, and she has been discharged from medical services.  I had previously left a messaged with Gloris Hamomieka which is the daughter the patient lives with.  I have not received a returned called. Appeal has been initiated by daughter [name].

## 2015-01-29 LAB — GLUCOSE, CAPILLARY
GLUCOSE-CAPILLARY: 76 mg/dL (ref 65–99)
GLUCOSE-CAPILLARY: 106 mg/dL — AB (ref 65–99)
Glucose-Capillary: 114 mg/dL — ABNORMAL HIGH (ref 65–99)
Glucose-Capillary: 86 mg/dL (ref 65–99)

## 2015-01-29 MED ORDER — SENNOSIDES-DOCUSATE SODIUM 8.6-50 MG PO TABS
1.0000 | ORAL_TABLET | Freq: Two times a day (BID) | ORAL | Status: DC
  Administered 2015-01-29 – 2015-01-31 (×5): 1 via ORAL
  Filled 2015-01-29 (×5): qty 1

## 2015-01-29 NOTE — Progress Notes (Signed)
[facility] [place] [facility]   PATIENT NAME: Kirsten Young    MR#:  [id number]  DATE OF BIRTH:  [birth year]  SUBJECTIVE:  Just finished her BF. No issues per RN. Pt has been pleasant on the floor She remembered and told me what hshe ate. Oriented to place,person, year, time. Knows the new president. Answers questins about her Young. Not confused at present  REVIEW OF SYSTEMS:   Review of Systems  Constitutional: Negative for fever, chills and weight loss.  HENT: Negative for ear discharge, ear pain and nosebleeds.   Eyes: Negative for blurred vision, pain and discharge.  Respiratory: Negative for sputum production, shortness of breath, wheezing and stridor.   Cardiovascular: Negative for chest pain, palpitations, orthopnea and PND.  Gastrointestinal: Negative for nausea, vomiting, abdominal pain and diarrhea.  Genitourinary: Negative for urgency and frequency.  Musculoskeletal: Negative for back pain and joint pain.  Neurological: Negative for sensory change, speech change, focal weakness and weakness.  Psychiatric/Behavioral: Negative for depression and hallucinations. The patient is not nervous/anxious.   All other systems reviewed and are negative.  Tolerating Diet:yes Tolerating PT: yes  DRUG ALLERGIES:  No Known Allergies  VITALS:  Blood pressure 109/50, pulse 64, temperature 97.5 F (36.4 C), temperature source Oral, resp. rate 20, height 5\' 6"  (1.676 m), weight 109.498 kg (241 lb 6.4 oz), SpO2 95 %.  PHYSICAL EXAMINATION:   Physical Exam  GENERAL:  55 y.o.-year-old patient lying in the bed with no acute distress.  EYES: Pupils equal, round, reactive to light and accommodation. No scleral icterus. Extraocular muscles intact.  HEENT: Head atraumatic, normocephalic. Oropharynx and nasopharynx clear. Hard on hear, some visual impairment NECK:  Supple, no jugular venous distention. No thyroid enlargement, no tenderness.  LUNGS: Normal  breath sounds bilaterally, no wheezing, rales, rhonchi. No use of accessory muscles of respiration.  CARDIOVASCULAR: S1, S2 normal. No murmurs, rubs, or gallops.  ABDOMEN: Soft, nontender, nondistended. Bowel sounds present. No organomegaly or mass.  EXTREMITIES: No cyanosis, clubbing or edema b/l.    NEUROLOGIC: Cranial nerves II through XII are intact. No focal Motor or sensory deficits b/l.   PSYCHIATRIC:  patient is alert and oriented x 3.  SKIN: No obvious rash, lesion, or ulcer.   LABORATORY PANEL:  CBC  Recent Labs Lab  0441  WBC 4.9  HGB 10.6*  HCT 34.3*  PLT 209    Chemistries   Recent Labs Lab 01/26/15 1553  0441  NA 139 141  K 4.2 4.0  CL 108 110  CO2 28 27  GLUCOSE 119* 131*  BUN 15 17  CREATININE 0.91 0.93  CALCIUM 8.6* 8.2*  AST 16  --   ALT 9*  --   ALKPHOS 137*  --   BILITOT 0.5  --     Cardiac Enzymes  Recent Labs Lab 01/26/15 1553  TROPONINI <0.03   RADIOLOGY:  No results found. ASSESSMENT AND PLAN:  Kirsten Young for altered mental status and a fall today. Patient states that she had a fall today and hit her neck on the wall. In the ER, a chest x-ray showed a left lower lobe pneumonia and hospitalist services were contacted for further evaluation.  1. Left lower lobe pneumonia with acute encephalopathy.improved. Mentation appropriate - IV Rocephin and oral Zithromax.--->change to po augmentin -wbc normal. No cough or fever.  -prn nebulizer treatments.  2. Type 2 diabetes with peripheral vascular disease and previous  amputations. Continue oral medications  3. Essential hypertension blood pressure borderline- continue her usual medications and monitor blood pressure  4. Hypothyroidism unspecified continue levothyroxine  5. Weakness physical therapy evaluation recommends rehab but pt owes ton of money to nursing facitliy. Will have to d/c her home with Young and try set up  [facility]  6.Young thinks pt is more confused than her baseline. CT head neg other than sinusitis. No neuro deficits. Answered all questions appropriately to me. She has hearing and visual impairment which at times can cause her difficulty to answer. She is pleasant and calm and does not appear confused at present. She denies hearing any voices  7. H/o depression (evaluated in ER on feb for psychosis) -pt went home from ER after psych eval was completed  -cont risperidone  Case discussed with Care Management/Social Worker. Management plans discussed with the patient and in agreement.  CODE STATUS:   DVT Prophylaxis: lovenox TOTAL TIME TAKING CARE OF THIS PATIENT: 30 minutes.  >50% time spent on counselling and coordination of care   D/C IDEPENDING ON Medicare appeal done by Young  Note: This dictation was prepared with Dragon dictation along with smaller phrase technology. Any transcriptional errors that result from this process are unintentional.  [name] M.D on 01/29/2015 at 9:11 AM  Between 7am to 6pm - Pager - [phone]  After 6pm go to [email or web address] - password [email or web address] [facility]  [place] [facility]  Office  [phone]  CC: Primary care physician; Derwood Kaplan, MD

## 2015-01-29 NOTE — Progress Notes (Signed)
[facility] [place] [facility]   PATIENT NAME: Kirsten Young Byer    MR#:  [id number]  DATE OF BIRTH:  [birth year]  SUBJECTIVE:  Just finished her BF. No issues per RN. Pt has been pleasant on the floor She remembered and told me what hshe ate. Oriented to place,person, year, time. Knows the new president. Answers questins about her family. Not confused at present  REVIEW OF SYSTEMS:   Review of Systems  Constitutional: Negative for fever, chills and weight loss.  HENT: Negative for ear discharge, ear pain and nosebleeds.   Eyes: Negative for blurred vision, pain and discharge.  Respiratory: Negative for sputum production, shortness of breath, wheezing and stridor.   Cardiovascular: Negative for chest pain, palpitations, orthopnea and PND.  Gastrointestinal: Negative for nausea, vomiting, abdominal pain and diarrhea.  Genitourinary: Negative for urgency and frequency.  Musculoskeletal: Negative for back pain and joint pain.  Neurological: Negative for sensory change, speech change, focal weakness and weakness.  Psychiatric/Behavioral: Negative for depression and hallucinations. The patient is not nervous/anxious.   All other systems reviewed and are negative.  Tolerating Diet:yes Tolerating PT: yes  DRUG ALLERGIES:  No Known Allergies  VITALS:  Blood pressure 109/50, pulse 64, temperature 97.5 F (36.4 C), temperature source Oral, resp. rate 20, height 5\' 6"  (1.676 m), weight 109.498 kg (241 lb 6.4 oz), SpO2 95 %.  PHYSICAL EXAMINATION:   Physical Exam  GENERAL:  55 y.o.-year-old patient lying in the bed with no acute distress.  EYES: Pupils equal, round, reactive to light and accommodation. No scleral icterus. Extraocular muscles intact.  HEENT: Head atraumatic, normocephalic. Oropharynx and nasopharynx clear. Hard on hear, some visual impairment NECK:  Supple, no jugular venous distention. No thyroid enlargement, no tenderness.  LUNGS: Normal  breath sounds bilaterally, no wheezing, rales, rhonchi. No use of accessory muscles of respiration.  CARDIOVASCULAR: S1, S2 normal. No murmurs, rubs, or gallops.  ABDOMEN: Soft, nontender, nondistended. Bowel sounds present. No organomegaly or mass.  EXTREMITIES: No cyanosis, clubbing or edema b/l.    NEUROLOGIC: Cranial nerves II through XII are intact. No focal Motor or sensory deficits b/l.   PSYCHIATRIC:  patient is alert and oriented x 3.  SKIN: No obvious rash, lesion, or ulcer.   LABORATORY PANEL:  CBC  Recent Labs Lab  0441  WBC 4.9  HGB 10.6*  HCT 34.3*  PLT 209    Chemistries   Recent Labs Lab 01/26/15 1553  0441  NA 139 141  K 4.2 4.0  CL 108 110  CO2 28 27  GLUCOSE 119* 131*  BUN 15 17  CREATININE 0.91 0.93  CALCIUM 8.6* 8.2*  AST 16  --   ALT 9*  --   ALKPHOS 137*  --   BILITOT 0.5  --     Cardiac Enzymes  Recent Labs Lab 01/26/15 1553  TROPONINI <0.03   RADIOLOGY:  No results found. ASSESSMENT AND PLAN:  Kirsten Young Voshell is a 55 y.o. female brought in by family for altered mental status and a fall today. Patient states that she had a fall today and hit her neck on the wall. In the ER, a chest x-ray showed a left lower lobe pneumonia and hospitalist services were contacted for further evaluation.  1. Left lower lobe pneumonia with acute encephalopathy.improved. Mentation appropriate - IV Rocephin and oral Zithromax.--->change to po augmentin -wbc normal. No cough or fever.  -prn nebulizer treatments.  2. Type 2 diabetes with peripheral vascular disease and previous  amputations. Continue oral medications  3. Essential hypertension blood pressure borderline- continue her usual medications and monitor blood pressure  4. Hypothyroidism unspecified continue levothyroxine  5. Weakness physical therapy evaluation recommends rehab but pt owes ton of money to nursing facitliy. Will have to d/c her home with family and [facility]  6.family thinks pt is more confused than her baseline. CT head neg other than sinusitis. No neuro deficits. Answered all questions appropriately to me. She has hearing and visual impairment which at times can cause her difficulty to answer. She is pleasant and calm and does not appear confused at present. She denies hearing any voices  7. 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 Management plans discussed with the patient and in agreement.  CODE STATUS:   DVT Prophylaxis: lovenox TOTAL TIME TAKING CARE OF THIS PATIENT: 30 minutes.  >50% time spent on counselling and coordination of care   D/C IDEPENDING ON Medicare appeal done by family  Note: This dictation was prepared with Dragon dictation along with smaller phrase technology. Any transcriptional errors that result from this process are unintentional.  Galan Ghee M.D on 01/29/2015 at 8:58 AM  Between 7am to 6pm - Pager - [phone]  After 6pm go to [email or web address] - password [email or web address] [facility]  [place]  Office  [phone]  CC: Primary care physician; Derwood KaplanEason,  Ernest B, MD

## 2015-01-30 DIAGNOSIS — F29 Unspecified psychosis not due to a substance or known physiological condition: Secondary | ICD-10-CM

## 2015-01-30 LAB — GLUCOSE, CAPILLARY
GLUCOSE-CAPILLARY: 86 mg/dL (ref 65–99)
Glucose-Capillary: 91 mg/dL (ref 65–99)
Glucose-Capillary: 98 mg/dL (ref 65–99)
Glucose-Capillary: 88 mg/dL (ref 65–99)

## 2015-01-30 LAB — CREATININE, SERUM
Creatinine, Ser: 0.9 mg/dL (ref 0.44–1.00)
GFR calc non Af Amer: >60 mL/min (ref >60)

## 2015-01-30 LAB — CBC WITH DIFFERENTIAL/PLATELET
BASOS ABS: 0.1 10*3/uL (ref 0–0.1)
Basophils Relative: 2 %
EOS PCT: 3 %
Eosinophils Absolute: 0.2 10*3/uL (ref 0–0.7)
HCT: 34 % — ABNORMAL LOW (ref 35.0–47.0)
Hemoglobin: 10.7 g/dL — ABNORMAL LOW (ref 12.0–16.0)
LYMPHS PCT: 29 %
Lymphs Abs: 1.4 10*3/uL (ref 1.0–3.6)
MCH: 22.6 pg — ABNORMAL LOW (ref 26.0–34.0)
MCHC: 31.5 g/dL — ABNORMAL LOW (ref 32.0–36.0)
MCV: 71.6 fL — AB (ref 80.0–100.0)
MONO ABS: 0.5 10*3/uL (ref 0.2–0.9)
Monocytes Relative: 10 %
Neutro Abs: 2.7 10*3/uL (ref 1.4–6.5)
Neutrophils Relative %: 56 %
PLATELETS: 212 10*3/uL (ref 150–440)
RBC: 4.75 MIL/uL (ref 3.80–5.20)
RDW: 15.9 % — AB (ref 11.5–14.5)
WBC: 4.8 10*3/uL (ref 3.6–11.0)

## 2015-01-30 MED ORDER — ARIPIPRAZOLE 2 MG PO TABS
2.0000 mg | ORAL_TABLET | Freq: Every day | ORAL | Status: DC
  Administered 2015-01-30 – 2015-01-31 (×2): 2 mg via ORAL
  Filled 2015-01-30 (×3): qty 1

## 2015-01-30 NOTE — Progress Notes (Signed)
[facility] - [place] at [facility]   PATIENT NAME: Kirsten Young    MR#:  [id number]  DATE OF BIRTH:  1960-09-10  SUBJECTIVE:  Just finished her BF. No issues per RN. Pt has been pleasant on the floor She remembered and told me what hshe ate. Not confused at present  REVIEW OF SYSTEMS:   Review of Systems  Constitutional: Negative for fever, chills and weight loss.  HENT: Negative for ear discharge, ear pain and nosebleeds.   Eyes: Negative for blurred vision, pain and discharge.  Respiratory: Negative for sputum production, shortness of breath, wheezing and stridor.   Cardiovascular: Negative for chest pain, palpitations, orthopnea and PND.  Gastrointestinal: Negative for nausea, vomiting, abdominal pain and diarrhea.  Genitourinary: Negative for urgency and frequency.  Musculoskeletal: Negative for back pain and joint pain.  Neurological: Negative for sensory change, speech change, focal weakness and weakness.  Psychiatric/Behavioral: Negative for depression and hallucinations. The patient is not nervous/anxious.   All other systems reviewed and are negative.  Tolerating Diet:yes Tolerating PT: yes  DRUG ALLERGIES:  No Known Allergies  VITALS:  Blood pressure 116/58, pulse 53, temperature 97.9 F (36.6 C), temperature source Oral, resp. rate 17, height 5\' 6"  (1.676 m), weight 109.498 kg (241 lb 6.4 oz), SpO2 90 %.  PHYSICAL EXAMINATION:   Physical Exam  GENERAL:  55 y.o.-year-old patient lying in the bed with no acute distress.  EYES: Pupils equal, round, reactive to light and accommodation. No scleral icterus. Extraocular muscles intact.  HEENT: Head atraumatic, normocephalic. Oropharynx and nasopharynx clear. Hard on hear, some visual impairment NECK:  Supple, no jugular venous distention. No thyroid enlargement, no tenderness.  LUNGS: Normal breath sounds bilaterally, no wheezing, rales, rhonchi. No use of accessory muscles of respiration.   CARDIOVASCULAR: S1, S2 normal. No murmurs, rubs, or gallops.  ABDOMEN: Soft, nontender, nondistended. Bowel sounds present. No organomegaly or mass.  EXTREMITIES: No cyanosis, clubbing or edema b/l.    NEUROLOGIC: Cranial nerves II through XII are intact. No focal Motor or sensory deficits b/l.   PSYCHIATRIC:  patient is alert and oriented x 3.  SKIN: No obvious rash, lesion, or ulcer.   LABORATORY PANEL:  CBC  Recent Labs Lab 01/30/15 0410  WBC 4.8  HGB 10.7*  HCT 34.0*  PLT 212    Chemistries   Recent Labs Lab 01/26/15 1553  0441 01/30/15 0410  NA 139 141  --   K 4.2 4.0  --   CL 108 110  --   CO2 28 27  --   GLUCOSE 119* 131*  --   BUN 15 17  --   CREATININE 0.91 0.93 0.90  CALCIUM 8.6* 8.2*  --   AST 16  --   --   ALT 9*  --   --   ALKPHOS 137*  --   --   BILITOT 0.5  --   --     Cardiac Enzymes  Recent Labs Lab 01/26/15 1553  TROPONINI <0.03   RADIOLOGY:  No results found. ASSESSMENT AND PLAN:  Kirsten Young is a 55 y.o. female brought in by family for altered mental status and a fall today. Patient states that she had a fall today and hit her neck on the wall. In the ER, a chest x-ray showed a left lower lobe pneumonia and hospitalist services were contacted for further evaluation.  1. Left lower lobe pneumonia with acute encephalopathy.improved. Mentation appropriate - IV Rocephin and oral Zithromax.--->change to po  augmentin -wbc normal. No cough or fever.  -prn nebulizer treatments.  2. 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 Continue oral medications  3. Essential hypertension blood pressure borderline- continue her usual medications and monitor blood pressure  4. Hypothyroidism unspecified continue levothyroxine  5. Weakness physical therapy evaluation recommends rehab but pt owes ton of money to nursing facitliy. Will have to d/c her home with family [facility]  6.family thinks pt is  more confused than her baseline. CT head neg other than sinusitis. No neuro deficits. Answered all questions appropriately to me. She has hearing and visual impairment which at times can cause her difficulty to answer. She is pleasant and calm and does not appear confused at present. She denies hearing any voices. Pt's medicare appeal went thru. Will consider psych consult for further evaluation since the appeal went thru.  7. H/o depression (evaluated in ER on feb for psychosis) -pt went home from ER after psych eval was completed  -cont risperidone  Case discussed with Care Management/Social Worker. Management plans discussed with the patient and in agreement.  CODE STATUS:   DVT Prophylaxis: lovenox TOTAL TIME TAKING CARE OF THIS PATIENT: 30 minutes.  >50% time spent on counselling and coordination of care   Note: This dictation was prepared with Dragon dictation along with smaller phrase technology. Any transcriptional errors that result from this process are unintentional.  Treshawn Allen M.D on 01/30/2015 at 2:12 PM  Between 7am to 6pm - Pager - [phone]  After 6pm go to [email or web address] - password [email or web address] [facility]  [facility]  Office  [phone]  CC: Primary care physician; Derwood KaplanEason,  Ernest B, MD

## 2015-01-30 NOTE — Consult Note (Signed)
[facility] Face-to-Face Psychiatry Consult   Reason for Consult:  Consult for this 55 year old woman currently in the hospital for pneumonia. Consult because family is concerned that the patient is confused and inappropriate for discharge. 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 Diagnosis:   Patient Active Problem List   Diagnosis Date Noted  . Psychosis [F29] 01/30/2015  . Pneumonia [J18.9] 01/26/2015  . Major depressive disorder, recurrent episode, severe, with psychotic behavior ([place]) [F33.3] 03/12/2014  . Chest pain due to psychological stress [R07.9, F43.9]   . Altered mental state [R41.82] 12/19/2013  . Hypothyroidism [E03.9] 12/19/2013  . Hypertension [I10] 12/19/2013  . Diabetes mellitus without complication ([place]) [K34.9] 12/19/2013  . Depression [F32.9] 12/19/2013  . Fever [R50.9] 12/19/2013  . Acute encephalopathy [G93.40] 12/19/2013  . Acute sinusitis [J01.90] 12/19/2013    Total Time spent with patient: 1 hour  Subjective:   Kirsten Young is a 55 y.o. female patient admitted with "my daughter thinks that I'm crazy but I'm not".  HPI:  Patient interviewed. Chart reviewed. Case discussed with the hospitalist and with the current nursing staff. Reviewed old notes as well and labs. I have not spoken to the family on the telephone. This 55 year old woman was brought into the hospital because of an acute fall and concerns about altered mental status. Pneumonia was subsequently found and treated. Hospital staff felt the patient was medically ready for discharge but the family is concerned that her mental status is still not right. On interview today I found the patient to be cooperative. Her hearing is very poor and to communicate with her you have to speak very loudly but on doing that I had no trouble communicating with her. Patient says  that in her opinion the family brought her in here for no particular reason at all. She says that she had a fall at home but didn't think that it was a big deal. She talks about going outdoors on the night that it snowed and not knowing that the snow had fallen and so she sat down in the snow. Not clear to me if she is talking about the fall at that time. As far as psychiatric symptoms the patient states that she has no feelings of depression. She denies being sad or down. Says that she enjoys life and has things she is looking forward to. Patient says that she does not have any awareness that she's been confused or having memory problems. She is concerned that her daughter has told her that she think she is "crazy". In fact the patient's chief complaint really is that her daughter allegedly called her and gain which is why the patient doesn't want to go back home with her. Patient then goes on to claim that the daughter is trying to poison her. She tells me that she can hear people talking outside her room in the hallway and that she has heard people saying that they are going to poison her. This happened apparently at home but also has happened in the hospital. She claims that she has heard her sister-in-law out in the hallway talking about poisoning her and that her daughter has said this as well. I pointed out to the patient that she can barely hear me when I am shouting two feet from her and it seems unlikely that she can hear people out in the hallway. Patient agrees to this but insists that  it is happening. Patient claims to me that this is been happening for about a month. Nursing reports that earlier today she did not seem paranoid but that the symptoms seem to come on as the day got later. The patient tells me that she had refused to take her evening medicine and had planned on hiding them in her mouth briefly and then throwing them away. Evidently she was not very dexterous at this because the nurse saw  her clearly not take her medicine. Patient tells me she is doing this because again she thinks people are trying to poison her. This is the same reason that she refused to eat any of her meal tonight.  Social history: Patient has recently been living with her daughter and her daughter's husband. The patient has multiple complaints about them, but I'm not sure she is a reliable historian. I do note that the patient was in the emergency room at [facility] behavioral health or [facility] earlier this year and they had some trouble getting the family to pick her up. I'm not sure if this is relevant. Patient is divorced. 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 Had to stop work when her diabetes and associated symptoms prevented her from doing the job. She speaks fondly of all of her children and it seems odd that she is now in this supposedly hostile relationship. She can't give me any logical reason why her daughter would want to poison her.  Medical history: Patient has diabetes. I am guessing that she may have gone for many years without adequate treatment of her diabetes allowing her to develop severe end organ damage. 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 She has had to have some toes amputated. It is not documented that she has had a stroke although I wouldn't be surprised if she's had some vascular impairment. Patient also has a history of hypothyroidism. Recently has had a pneumonia.  Substance abuse history: Patient denies any current or past drug or alcohol abuse  Past Psychiatric History: Patient was evaluated by [facility] earlier this year. Circumstances seemed pretty similar. One interviewer came to the conclusion that she had a major depression with psychosis although it doesn't sound like the symptoms were very consistent. They tried to send her to a geriatric psychiatry unit but were unable to find one. Eventually they felt she was  ready to be discharged home. It's not clear that she has had any psychiatric follow-up in the interval. He is not aware of any. She denies ever being admitted to a psychiatric hospital. Denies ever having had psychiatric treatment earlier in her life. Denies any history of suicide attempts or violence.  Risk to Self: Is patient at risk for suicide?: No Risk to Others:   Prior Inpatient Therapy:   Prior Outpatient Therapy:    Past Medical History:  Past Medical History  Diagnosis Date  . Renal disorder   . Hypertension   . Diabetes mellitus without complication ([place])   . Thyroid disease     Past Surgical History  Procedure Laterality Date  . Foot surgery     Family History:  Family History  Problem Relation Age of Onset  . Diabetes Father    Family Psychiatric  History: Patient says that one of her brothers was "slow" but she doesn't know of any other family history of mental health problems. Social History:  History  Alcohol Use No  History  Drug Use No    Social History   Social History  . Marital Status: Divorced    Spouse Name: N/A  . Number of Children: N/A  . Years of Education: N/A   Social History Main Topics  . Smoking status: Never Smoker   . Smokeless tobacco: None  . Alcohol Use: No  . Drug Use: No  . Sexual Activity: Not Asked   Other Topics Concern  . None   Social History Narrative   Additional Social History:                          Allergies:  No Known Allergies  Labs:  Results for orders placed or performed during the hospital encounter of 01/26/15 (from the past 48 hour(s))  Glucose, capillary     Status: None   Collection Time: 01/28/15  9:24 PM  Result Value Ref Range   Glucose-Capillary 98 65 - 99 mg/dL   Comment 1 Notify RN   Glucose, capillary     Status: None   Collection Time: 01/29/15  7:29 AM  Result Value Ref Range   Glucose-Capillary 76 65 - 99 mg/dL  Glucose, capillary     Status: Abnormal   Collection  Time: 01/29/15 11:30 AM  Result Value Ref Range   Glucose-Capillary 114 (H) 65 - 99 mg/dL  Glucose, capillary     Status: None   Collection Time: 01/29/15  4:31 PM  Result Value Ref Range   Glucose-Capillary 86 65 - 99 mg/dL  Glucose, capillary     Status: Abnormal   Collection Time: 01/29/15  8:45 PM  Result Value Ref Range   Glucose-Capillary 106 (H) 65 - 99 mg/dL   Comment 1 Notify RN   CBC with Differential/Platelet     Status: Abnormal   Collection Time: 01/30/15  4:10 AM  Result Value Ref Range   WBC 4.8 3.6 - 11.0 K/uL   RBC 4.75 3.80 - 5.20 MIL/uL   Hemoglobin 10.7 (L) 12.0 - 16.0 g/dL   HCT 34.0 (L) 35.0 - 47.0 %   MCV 71.6 (L) 80.0 - 100.0 fL   MCH 22.6 (L) 26.0 - 34.0 pg   MCHC 31.5 (L) 32.0 - 36.0 g/dL   RDW 15.9 (H) 11.5 - 14.5 %   Platelets 212 150 - 440 K/uL   Neutrophils Relative % 56 %   Neutro Abs 2.7 1.4 - 6.5 K/uL   Lymphocytes Relative 29 %   Lymphs Abs 1.4 1.0 - 3.6 K/uL   Monocytes Relative 10 %   Monocytes Absolute 0.5 0.2 - 0.9 K/uL   Eosinophils Relative 3 %   Eosinophils Absolute 0.2 0 - 0.7 K/uL   Basophils Relative 2 %   Basophils Absolute 0.1 0 - 0.1 K/uL  Creatinine, serum     Status: None   Collection Time: 01/30/15  4:10 AM  Result Value Ref Range   Creatinine, Ser 0.90 0.44 - 1.00 mg/dL   GFR calc non Af Amer >60 >60 mL/min   GFR calc Af Amer >60 >60 mL/min    Comment: (NOTE) The eGFR has been calculated using the CKD EPI equation. This calculation has not been validated in all clinical situations. eGFR's persistently <60 mL/min signify possible Chronic Kidney Disease.   Glucose, capillary     Status: None   Collection Time: 01/30/15  7:38 AM  Result Value Ref Range   Glucose-Capillary 91 65 - 99 mg/dL  Glucose, capillary  Status: None   Collection Time: 01/30/15 11:33 AM  Result Value Ref Range   Glucose-Capillary 98 65 - 99 mg/dL  Glucose, capillary     Status: None   Collection Time: 01/30/15  5:04 PM  Result Value Ref  Range   Glucose-Capillary 88 65 - 99 mg/dL    Current Facility-Administered Medications  Medication Dose Route Frequency Provider Last Rate Last Dose  . acetaminophen (TYLENOL) tablet 650 mg  650 mg Oral Q6H PRN Loletha Grayer, MD       Or  . acetaminophen (TYLENOL) suppository 650 mg  650 mg Rectal Q6H PRN Loletha Grayer, MD      . amoxicillin-clavulanate (AUGMENTIN) 875-125 MG per tablet 1 tablet  1 tablet Oral Q12H Fritzi Mandes, MD   1 tablet at 01/30/15 1022  . ARIPiprazole (ABILIFY) tablet 2 mg  2 mg Oral Daily Gonzella Lex, MD      . atenolol (TENORMIN) tablet 50 mg  50 mg Oral Daily Loletha Grayer, MD   50 mg at 01/30/15 1023  . enoxaparin (LOVENOX) injection 40 mg  40 mg Subcutaneous Q24H YOMARA TOOTHMAN, RPH   40 mg at 01/29/15 2322  . insulin aspart (novoLOG) injection 0-5 Units  0-5 Units Subcutaneous QHS Loletha Grayer, MD   0 Units at  0031  . insulin aspart (novoLOG) injection 0-9 Units  0-9 Units Subcutaneous TID WC Loletha Grayer, MD   1 Units at 01/28/15 1242  . ipratropium-albuterol (DUONEB) 0.5-2.5 (3) MG/3ML nebulizer solution 3 mL  3 mL Nebulization Q6H PRN Fritzi Mandes, MD      . levothyroxine (SYNTHROID, LEVOTHROID) tablet 125 mcg  125 mcg Oral QAC breakfast Loletha Grayer, MD   125 mcg at 01/30/15 1022  . lisinopril (PRINIVIL,ZESTRIL) tablet 5 mg  5 mg Oral Daily Loletha Grayer, MD   5 mg at 01/30/15 1022  . metFORMIN (GLUCOPHAGE) tablet 500 mg  500 mg Oral BID WC Loletha Grayer, MD   500 mg at 01/30/15 1022  . senna-docusate (Senokot-S) tablet 1 tablet  1 tablet Oral BID ROZELL KETTLEWELL, [facility]   1 tablet at 01/30/15 1022    Musculoskeletal: Strength & Muscle Tone: within normal limits Gait & Station: normal Patient leans: N/A  Psychiatric Specialty Exam: Review of Systems  Constitutional: Negative.   HENT: Positive for hearing loss.   Eyes: Positive for blurred vision.  Respiratory: Positive for cough.   Cardiovascular: Negative.   Gastrointestinal:  Negative.   Musculoskeletal: Negative.   Skin: Negative.   Neurological: Negative.   Psychiatric/Behavioral: Positive for hallucinations. Negative for depression, suicidal ideas, memory loss and substance abuse. The patient is nervous/anxious. The patient does not have insomnia.     Blood pressure 116/58, pulse 53, temperature 97.9 F (36.6 C), temperature source Oral, resp. rate 17, height 5' 6"  (1.676 m), weight 109.498 kg (241 lb 6.4 oz), SpO2 90 %.Body mass index is 38.98 kg/(m^2).  General Appearance: Casual  Eye Contact::  Minimal  Speech:  Normal Rate  Volume:  Normal  Mood:  Euthymic  Affect:  Full Range  Thought Process:  Coherent and Most of her thinking appears to be quite coherent and she is able to have a very reasonable conversation and give quite a bit of detail about her current and past life. She becomes a little incoherent however when she starts to talk about the paranoid thoughts that she is having recently.  Orientation:  Full (Time, Place, and Person)  Thought Content:  Delusions, Hallucinations: Auditory and  Paranoid Ideation  Suicidal Thoughts:  No  Homicidal Thoughts:  No  Memory:  Immediate;   Fair Recent;   Fair Remote;   Good  Judgement:  Impaired  Insight:  Shallow  Psychomotor Activity:  Normal  Concentration:  Fair  Recall:  [place]: Fair  Akathisia:  No  Handed:  Right  AIMS (if indicated):     Assets:  Communication Skills Housing Resilience Social Support  ADL's:  Intact  Cognition: WNL  Sleep:      Treatment Plan Summary: Daily contact with patient to assess and evaluate symptoms and progress in treatment, Medication management and Plan Interesting patient. 55 year old woman who has suffered quite a bit of end organ damage from her diabetes and is quite impaired with major loss of eyesight and hearing. To my interview she does not appear to be particularly demented. I did not try to do a full Mini-Mental status  exam because so much of it relies on vision, but the patient was able to repeat 3 words at 3 minutes without any difficulty. She was completely alert and oriented to the date place time and situation. She was able to give a good accounting of her earlier life. She also did not appear to be depressed and had a full range of affect. What is remarkable is that she is clearly acting on paranoid psychotic believes. She is hearing voices outside her room and believes that people are trying to poison her. As a result she is refusing medicine and food in the hospital. 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 If the patient is acting on these psychotic believes in symptoms when she is at home I can certainly see why the family is concerned about taking care of her. It's not clear to me that she needs psychiatric inpatient treatment although that may be an option. I am going to switch the low dose risperidone to Abilify to be given in the morning 2 mg for psychotic symptoms. Patient is telling me that she wants to go live with her son. Nursing is unaware as to whether this is an option that has been considered. She does not want to go home to stay with her daughter. If discharge could be arranged to a safe environment it would probably be worth trying although if her psychotic symptoms continue and are interfering with her behavior at home we may need to increase the treatment. I will follow-up as needed.  Disposition: Supportive therapy provided about ongoing stressors.  John Clapacs 01/30/2015 7:05 PM

## 2015-01-30 NOTE — Clinical Social Work Note (Signed)
CSW spoke with Tomika, patient's daughter, this afternoon regarding no bed offers in [facility] and needing to extend the search. CSW explained patient's referral was sent out to [facility] and that [facility] offered a bed. Tomika stated that she knows some of their staff and would accept their bed offer. She mentioned [facility] [place] as well but stated she will speak with her sister and then let me know if they change their mind. York Spaniel MSW,LCSW [phone]

## 2015-01-31 LAB — GLUCOSE, CAPILLARY
GLUCOSE-CAPILLARY: 86 mg/dL (ref 65–99)
Glucose-Capillary: 94 mg/dL (ref 65–99)

## 2015-01-31 MED ORDER — ARIPIPRAZOLE 2 MG PO TABS: 2.0000 mg | ORAL_TABLET | Freq: Every day | ORAL | Status: DC

## 2015-01-31 NOTE — Care Management (Signed)
Late entry.   Determination of appeal in favor of patient.  Extended bed search for SNF initiated.  CSW following.  RNCM signing off

## 2015-01-31 NOTE — Clinical Social Work Placement (Signed)
   CLINICAL SOCIAL WORK PLACEMENT  NOTE  Date:  01/31/2015  Patient Details  Name: Kirsten Young MRN: [id number] Date of Birth: 10-Apr-1960  Clinical Social Work is seeking post-discharge placement for this patient at the Skilled  Nursing Facility level of care (*CSW will initial, date and re-position this form in  chart as items are completed):  Yes   Patient/family provided with [place] Clinical Social Work Department's list of facilities offering this level of care within the geographic area requested by the patient (or if unable, by the patient's family).  Yes   Patient/family informed of their freedom to choose among providers that offer the needed level of care, that participate in Medicare, Medicaid or managed care program needed by the patient, have an available bed and are willing to accept the patient.  Yes   Patient/family informed of [facility]'s ownership interest [facility] and [facility], as well as of the fact that they are under no obligation to receive care at these facilities.  PASRR submitted to EDS on       PASRR number received on       Existing PASRR number confirmed on      FL2 transmitted to all facilities in geographic area requested by pt/family on      FL2 transmitted to all facilities within larger geographic area on       Patient informed that his/her managed care company has contracts with or will negotiate with certain facilities, including the following:        Yes   Patient/family informed of bed offers received.  Patient chooses bed at  [facility])     Physician recommends and patient chooses bed at  [facility](SNF)    Patient to be transferred to  [facility]) on 01/31/15.  Patient to be transferred to facility by  (EMS)     Patient family notified on 01/31/15 of transfer.  Name of family member notified:   Corrie Dandy(Tanya: daughter)     PHYSICIAN       Additional Comment:     _______________________________________________ York SpanielMonica Wassim Kirksey, LCSW 01/31/2015, 2:35 PM

## 2015-01-31 NOTE — Progress Notes (Signed)
Pt states, "There's a girl outside trying to kill me. There are demons after me and I told her if she comes in here, she will wear this remote upside her head."

## 2015-01-31 NOTE — Discharge Summary (Signed)
[facility] - [place] [facility]   PATIENT NAME: Genna Casimir    MR#:  [id number]  DATE OF BIRTH:  [birth year]  DATE OF ADMISSION:  01/26/2015 ADMITTING PHYSICIAN: Alford Highland, MD  DATE OF DISCHARGE: 01/31/2015  PRIMARY CARE PHYSICIAN: Derwood Kaplan, MD    ADMISSION DIAGNOSIS:  fall not eating confused chest pains  DISCHARGE DIAGNOSIS:  Pneumonia Intermittent confusion suspected due to paranoia/possible late onset schizophrenia-for psych eval Hypertension Diabetes Visual and hearing impairment chronic  SECONDARY DIAGNOSIS:   Past Medical History  Diagnosis Date  . Renal disorder   . Hypertension   . Diabetes mellitus without complication (HCC)   . Thyroid disease     HOSPITAL COURSE:  Makensey Rego is a 55 y.o. female brought in by family for altered mental status and a fall today. Patient states that she had a fall today and hit her neck on the wall. In the ER, a chest x-ray showed a left lower lobe pneumonia and hospitalist services were contacted for further evaluation.  1. Left lower lobe pneumonia with acute encephalopathy.improved. Mentation appropriate - IV Rocephin and oral Zithromax.--->change to po augmentin (completed treatment) -wbc normal. No cough or fever.  -prn nebulizer treatments.  2. 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 Continue oral medications  3. Essential hypertension blood pressure borderline- continue her usual medications and monitor blood pressure  4. Hypothyroidism unspecified continue levothyroxine  5. Weakness physical therapy evaluation recommends rehab patient has been off for at [facility]   6. Intermittent periods of confusion  -Patient was evaluated by psychiatrist Dr Toni Amend -Started on Abilify  -The possibility of late onset paranoid schizophrenia for psych eval.  Patient medically is stable.   discussed with Child psychotherapist. We'll discharge  patient to rehabilitation in the [facility]  CONSULTS OBTAINED:  Treatment Team:  Alford Highland, MD Audery Amel, MD  DRUG ALLERGIES:  No Known Allergies  DISCHARGE MEDICATIONS:   Current Discharge Medication List    START taking these medications   Details  amoxicillin-clavulanate (AUGMENTIN) 875-125 MG tablet Take 1 tablet by mouth every 12 (twelve) hours. Qty: 10 tablet, Refills: 0    ARIPiprazole (ABILIFY) 2 MG tablet Take 1 tablet (2 mg total) by mouth daily. Qty: 30 tablet, Refills: 3      CONTINUE these medications which have NOT CHANGED   Details  acetaminophen (TYLENOL) 325 MG tablet Take 650 mg by mouth every 6 (six) hours as needed for moderate pain or headache.    atenolol (TENORMIN) 50 MG tablet Take 50 mg by mouth daily.    furosemide (LASIX) 40 MG tablet Take 40 mg by mouth daily.    glimepiride (AMARYL) 4 MG tablet Take 4 mg by mouth daily with breakfast.    levothyroxine (SYNTHROID, LEVOTHROID) 125 MCG tablet Take 125 mcg by mouth daily before breakfast.    lisinopril (PRINIVIL,ZESTRIL) 5 MG tablet Take 5 mg by mouth daily.    metFORMIN (GLUCOPHAGE) 500 MG tablet Take by mouth 2 (two) times daily with a meal.    potassium chloride SA (K-DUR,KLOR-CON) 20 MEQ tablet Take 20 mEq by mouth daily.    risperiDONE (RISPERDAL M-TABS) 1 MG disintegrating tablet Take 1 tablet (1 mg total) by mouth at bedtime. Qty: 30 tablet, Refills: 0      STOP taking these medications     ciprofloxacin (CIPRO) 250 MG tablet         If you experience worsening of  your admission symptoms, develop shortness of breath, life threatening emergency, suicidal or homicidal thoughts you must seek medical attention immediately by calling 911 or calling your MD immediately  if symptoms less severe.  You Must read complete instructions/literature along with all the possible adverse reactions/side effects for all the Medicines you take and that have been prescribed to you.  Take any new Medicines after you have completely understood and accept all the possible adverse reactions/side effects.   Please note  You were cared for by a hospitalist during your hospital stay. If you have any questions about your discharge medications or the care you received while you were in the hospital after you are discharged, you can call the unit and asked to speak with the hospitalist on call if the hospitalist that took care of you is not available. Once you are discharged, your primary care physician will handle any further medical issues. Please note that NO REFILLS for any discharge medications will be authorized once you are discharged, as it is imperative that you return to your primary care physician (or establish a relationship with a primary care physician if you do not have one) for your aftercare needs so that they can reassess your need for medications and monitor your lab values. Today   SUBJECTIVE   No complaints   VITAL SIGNS:  Blood pressure 126/69, pulse 54, temperature 97.6 F (36.4 C), temperature source Oral, resp. rate 17, height 5\' 6"  (1.676 m), weight 109.498 kg (241 lb 6.4 oz), SpO2 97 %.  I/O:   Intake/Output Summary (Last 24 hours) at 01/31/15 1355 Last data filed at 01/31/15 1114  Gross per 24 hour  Intake    714 ml  Output    575 ml  Net    139 ml    PHYSICAL EXAMINATION:  GENERAL:  55 y.o.-year-old patient lying in the bed with no acute distress.  EYES: Pupils equal, round, reactive to light and accommodation. No scleral icterus. Extraocular muscles intact.  HEENT: Head atraumatic, normocephalic. Oropharynx and nasopharynx clear.  NECK:  Supple, no jugular venous distention. No thyroid enlargement, no tenderness.  LUNGS: Normal breath sounds bilaterally, no wheezing, rales,rhonchi or crepitation. No use of accessory muscles of respiration.  CARDIOVASCULAR: S1, S2 normal. No murmurs, rubs, or gallops.  ABDOMEN: Soft, non-tender, non-distended.  Bowel sounds present. No organomegaly or mass.  EXTREMITIES: No pedal edema, cyanosis, or clubbing.  NEUROLOGIC: Cranial nerves II through XII are intact. Muscle strength 5/5 in all extremities. Sensation intact. Gait not checked.  PSYCHIATRIC: The patient is alert and oriented x 2.  SKIN: No obvious rash, lesion, or ulcer.   DATA REVIEW:   CBC   Recent Labs Lab 01/30/15 0410  WBC 4.8  HGB 10.7*  HCT 34.0*  PLT 212    Chemistries   Recent Labs Lab 01/26/15 1553  0441 01/30/15 0410  NA 139 141  --   K 4.2 4.0  --   CL 108 110  --   CO2 28 27  --   GLUCOSE 119* 131*  --   BUN 15 17  --   CREATININE 0.91 0.93 0.90  CALCIUM 8.6* 8.2*  --   AST 16  --   --   ALT 9*  --   --   ALKPHOS 137*  --   --   BILITOT 0.5  --   --     Microbiology Results   No results found for this or any previous visit (from the past  240 hour(s)).  RADIOLOGY:  No results found.   Management plans discussed with the patient, family and they are in agreement.  CODE STATUS:     Code Status Orders        Start     Ordered   01/26/15 2102  Full code   Continuous     01/26/15 2102    Code Status History    Date Active Date Inactive Code Status Order ID Comments User Context   03/12/2014 12:57 AM 03/15/2014  4:10 PM Full Code [id number]  Harle Battiest, NP ED   12/19/2013  2:48 AM 12/22/2013  4:45 PM Full Code [id number]  Lorretta Harp, MD Inpatient      TOTAL TIME TAKING CARE OF THIS PATIENT: 40 minutes.    Eshal Propps M.D on 01/31/2015 at 1:55 PM  Between 7am to 6pm - Pager - [phone] After 6pm go to [email or web address] - password [email or web address] [facility]  [facility]  Office  [phone]  CC: Primary care physician; Derwood Kaplan, MD

## 2015-01-31 NOTE — Clinical Social Work Note (Signed)
Patient to [facility] today [facility]. Tanje [facility] has been made aware that patient is very hard of hearing and will strike out if she is startled or scared. CSW sent discharge information via epic and the scripts for controlled meds are in the packet going with patient. Patient's daughter, [name], is aware of discharge and in agreement. Patient's daughter, [name], is going to meet patient there this afternoon with her other sister. Patient is aware of the discharge and where she is going as well.  York SpanielMonica Rio Taber MSW,LCSW [phone]

## 2015-01-31 NOTE — Progress Notes (Signed)
Report called to [place] at [facility]. EMS has been notified and will await arrival.

## 2015-01-31 NOTE — Care Management Important Message (Signed)
Important Message  Patient Details  Name: Kirsten ChessmanMary E Young MRN: [id number] Date of Birth: [birth year]   Medicare Important Message Given:  Yes 2nd notice given    Berna BueCheryl Rhonda Vangieson, RN 01/31/2015, 8:45 AM

## 2015-02-06 ENCOUNTER — Encounter (HOSPITAL_COMMUNITY): Payer: Self-pay | Admitting: Emergency Medicine

## 2015-02-06 ENCOUNTER — Emergency Department (HOSPITAL_COMMUNITY)
Admission: EM | Admit: 2015-02-06 | Discharge: 2015-02-06 | Disposition: A | Discharge status: Home / Self Care | Payer: Medicare Other | Attending: Emergency Medicine | Admitting: Emergency Medicine

## 2015-02-06 ENCOUNTER — Emergency Department (HOSPITAL_COMMUNITY): Payer: Medicare Other

## 2015-02-06 DIAGNOSIS — F23 Brief psychotic disorder: Secondary | ICD-10-CM | POA: Diagnosis not present

## 2015-02-06 DIAGNOSIS — I10 Essential (primary) hypertension: Secondary | ICD-10-CM | POA: Diagnosis not present

## 2015-02-06 DIAGNOSIS — Z87448 Personal history of other diseases of urinary system: Secondary | ICD-10-CM | POA: Diagnosis not present

## 2015-02-06 DIAGNOSIS — E119 Type 2 diabetes mellitus without complications: Secondary | ICD-10-CM | POA: Insufficient documentation

## 2015-02-06 DIAGNOSIS — H9190 Unspecified hearing loss, unspecified ear: Secondary | ICD-10-CM | POA: Insufficient documentation

## 2015-02-06 DIAGNOSIS — Z79899 Other long term (current) drug therapy: Secondary | ICD-10-CM | POA: Diagnosis not present

## 2015-02-06 DIAGNOSIS — R109 Unspecified abdominal pain: Secondary | ICD-10-CM | POA: Insufficient documentation

## 2015-02-06 DIAGNOSIS — R404 Transient alteration of awareness: Secondary | ICD-10-CM | POA: Diagnosis not present

## 2015-02-06 DIAGNOSIS — R451 Restlessness and agitation: Secondary | ICD-10-CM | POA: Diagnosis present

## 2015-02-06 DIAGNOSIS — F29 Unspecified psychosis not due to a substance or known physiological condition: Secondary | ICD-10-CM

## 2015-02-06 DIAGNOSIS — R2243 Localized swelling, mass and lump, lower limb, bilateral: Secondary | ICD-10-CM | POA: Insufficient documentation

## 2015-02-06 DIAGNOSIS — E079 Disorder of thyroid, unspecified: Secondary | ICD-10-CM | POA: Insufficient documentation

## 2015-02-06 DIAGNOSIS — R4182 Altered mental status, unspecified: Secondary | ICD-10-CM | POA: Diagnosis present

## 2015-02-06 DIAGNOSIS — H540 Blindness, both eyes: Secondary | ICD-10-CM | POA: Diagnosis not present

## 2015-02-06 DIAGNOSIS — Z7984 Long term (current) use of oral hypoglycemic drugs: Secondary | ICD-10-CM | POA: Insufficient documentation

## 2015-02-06 HISTORY — DX: Schizophrenia, unspecified: F20.9

## 2015-02-06 HISTORY — DX: Family history of other specified conditions: Z84.89

## 2015-02-06 LAB — URINALYSIS, ROUTINE W REFLEX MICROSCOPIC
Bilirubin Urine: NEGATIVE
GLUCOSE, UA: NEGATIVE mg/dL
Hgb urine dipstick: NEGATIVE
Ketones, ur: NEGATIVE mg/dL
LEUKOCYTES UA: NEGATIVE
NITRITE: NEGATIVE
PH: 5.5 (ref 5.0–8.0)
PROTEIN: NEGATIVE mg/dL
Specific Gravity, Urine: 1.019 (ref 1.005–1.030)

## 2015-02-06 LAB — COMPREHENSIVE METABOLIC PANEL
ALT: 24 U/L (ref 14–54)
ANION GAP: 11 (ref 5–15)
AST: 27 U/L (ref 15–41)
Albumin: 4 g/dL (ref 3.5–5.0)
Alkaline Phosphatase: 163 U/L — ABNORMAL HIGH (ref 38–126)
BUN: 26 mg/dL — AB (ref 6–20)
CHLORIDE: 104 mmol/L (ref 101–111)
CO2: 29 mmol/L (ref 22–32)
Calcium: 9.4 mg/dL (ref 8.9–10.3)
Creatinine, Ser: 1.16 mg/dL — ABNORMAL HIGH (ref 0.44–1.00)
GFR, EST NON AFRICAN AMERICAN: 53 mL/min — AB (ref >60)
Glucose, Bld: 123 mg/dL — ABNORMAL HIGH (ref 65–99)
POTASSIUM: 4.9 mmol/L (ref 3.5–5.1)
Sodium: 144 mmol/L (ref 135–145)
Total Bilirubin: 0.4 mg/dL (ref 0.3–1.2)
Total Protein: 8.9 g/dL — ABNORMAL HIGH (ref 6.5–8.1)

## 2015-02-06 LAB — I-STAT TROPONIN, ED: TROPONIN I, POC: 0 ng/mL (ref 0.00–0.08)

## 2015-02-06 LAB — CBC WITH DIFFERENTIAL/PLATELET
BASOS ABS: 0 10*3/uL (ref 0.0–0.1)
Basophils Relative: 1 %
Eosinophils Absolute: 0.1 10*3/uL (ref 0.0–0.7)
Eosinophils Relative: 1 %
HEMATOCRIT: 41.9 % (ref 36.0–46.0)
Hemoglobin: 13.1 g/dL (ref 12.0–15.0)
LYMPHS PCT: 23 %
Lymphs Abs: 2 10*3/uL (ref 0.7–4.0)
MCH: 23 pg — ABNORMAL LOW (ref 26.0–34.0)
MCHC: 31.3 g/dL (ref 30.0–36.0)
MCV: 73.5 fL — AB (ref 78.0–100.0)
Monocytes Absolute: 0.6 10*3/uL (ref 0.1–1.0)
Monocytes Relative: 8 %
NEUTROS ABS: 5.8 10*3/uL (ref 1.7–7.7)
NEUTROS PCT: 67 %
Platelets: 275 10*3/uL (ref 150–400)
RBC: 5.7 MIL/uL — AB (ref 3.87–5.11)
RDW: 16 % — ABNORMAL HIGH (ref 11.5–15.5)
WBC: 8.6 10*3/uL (ref 4.0–10.5)

## 2015-02-06 LAB — RAPID URINE DRUG SCREEN, HOSP PERFORMED
AMPHETAMINES: NOT DETECTED
BARBITURATES: NOT DETECTED
BENZODIAZEPINES: NOT DETECTED
COCAINE: NOT DETECTED
Opiates: NOT DETECTED
Tetrahydrocannabinol: NOT DETECTED

## 2015-02-06 LAB — LIPASE, BLOOD: LIPASE: 23 U/L (ref 11–51)

## 2015-02-06 MED ORDER — LORAZEPAM 0.5 MG PO TABS: 0.5000 mg | ORAL_TABLET | Freq: Two times a day (BID) | ORAL | Status: AC | PRN

## 2015-02-06 MED ORDER — FUROSEMIDE 40 MG PO TABS: 40.0000 mg | ORAL_TABLET | Freq: Every day | ORAL | Status: AC

## 2015-02-06 MED ORDER — LEVOTHYROXINE SODIUM 125 MCG PO TABS: 125.0000 ug | ORAL_TABLET | Freq: Every day | ORAL | Status: AC

## 2015-02-06 MED ORDER — GLIMEPIRIDE 4 MG PO TABS: 4.0000 mg | ORAL_TABLET | Freq: Every day | ORAL | Status: AC

## 2015-02-06 MED ORDER — ARIPIPRAZOLE 2 MG PO TABS: 2.0000 mg | ORAL_TABLET | Freq: Every day | ORAL | Status: AC

## 2015-02-06 MED ORDER — LISINOPRIL 5 MG PO TABS: 5.0000 mg | ORAL_TABLET | Freq: Every day | ORAL | Status: AC

## 2015-02-06 MED ORDER — LORAZEPAM 0.5 MG PO TABS: 0.5000 mg | ORAL_TABLET | Freq: Two times a day (BID) | ORAL | Status: DC | PRN

## 2015-02-06 MED ORDER — POTASSIUM CHLORIDE CRYS ER 20 MEQ PO TBCR: 20.0000 meq | EXTENDED_RELEASE_TABLET | Freq: Every day | ORAL | Status: AC

## 2015-02-06 NOTE — ED Notes (Signed)
Patient denies SI/HI, AVH, reports facility told her she would be going home on Wednesday and she wasn't allowed to leave. Patient is calm and cooperative, denies c/c.
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
## 2015-02-06 NOTE — ED Notes (Signed)
Attempted to call report

## 2015-02-06 NOTE — ED Notes (Signed)
Gave report to RN at [facility], tx called

## 2015-02-06 NOTE — ED Notes (Signed)
Patient presents via EMS for psychological evaluation from [facility] center. Staff c/o increasing agitation and reports patient was combative and striking staff.  Last VS: 142/85, 72hr, 16resp, cbg 136

## 2015-02-06 NOTE — Consult Note (Signed)
[facility] Consult   Reason for Consult:  Altered mental status Referring Physician:  EDP Patient Identification: Kirsten Young MRN:  [id number] Principal Diagnosis: Altered mental state Diagnosis:   Patient Active Problem List   Diagnosis Date Noted  . Altered mental state [R41.82] 12/19/2013    Priority: High  . Psychosis [F29] 01/30/2015  . Pneumonia [J18.9] 01/26/2015  . Major depressive disorder, recurrent episode, severe, with psychotic behavior ([place]) [F33.3] 03/12/2014  . Chest pain due to psychological stress [R07.9, F43.9]   . Hypothyroidism [E03.9] 12/19/2013  . Hypertension [I10] 12/19/2013  . Diabetes mellitus without complication ([place]) [P79.4] 12/19/2013  . Depression [F32.9] 12/19/2013  . Fever [R50.9] 12/19/2013  . Acute encephalopathy [G93.40] 12/19/2013  . Acute sinusitis [J01.90] 12/19/2013    Total Time spent with patient: 45 minutes  Subjective:   Kirsten Young is a 55 y.o. female patient admitted with reports of acute transient alteration in mental status of unknown etiology. May be organic vs. Inorganic. Organic causes ruled out in ED. Pt seen and chart reviewed with NP/MD team with Dr. Darleene Cleaver this AM. Pt is alert/oriented x4, calm, cooperative, and appropriate to situation. Pt denies suicidal/homicidal ideation and psychosis and does not appear to be responding to internal stimuli. Pt has improved and this altered mental status appears to have been transient. Pt deemed stable for discharge back to SNF/rehab facility.   (this note was written on behalf of Charmaine Downs, NP and Dr. Darleene Cleaver)  HPI:  I have reviewed and concur with HPI elements from ED, modified as follows:Kirsten Young is an 55 y.o. female. Pt denies SI. Pt reports previous HI. According to the Pt, she wanted to shoot 1 of the staff at [facility] because "she did not feel that she is being treated nicely." Pt was recently diagnosed with schizophrenia. Pt is  prescribed Risperdal. Pt lives at [facility]. 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 Pt walks with a walker but can complete all ADLs. Pt was poor historian.  Collateral contact: Pt's daughter Kirsten Young reports the following: Pt never had any mental health concerns until 5 years ago. Pt then began to have times of confusion and appeared oriented. Kirsten Young said that the Pt would soon return back to normal. Kirsten Young states that the Pt's behavior is worsening and she is becoming less and less oriented. The Pt just began living at [facility] a week ago. Kirsten Young states that she feels that the Risperdal worsened her mother's behavior. Kirsten Young also suspects early onset of Demenia or Alzhemiers but her mother has not been tested.   Writer consulted with Dr. Darleene Cleaver and Reginold Agent, NP. Per Dr. Darleene Cleaver and Reginold Agent, NP Pt does not meet inpatient criteria. Pt will be D/C back to rehab program.  Past Psychiatric History: Agitation/psychosis  Risk to Self: Suicidal Ideation: No Suicidal Intent: No Is patient at risk for suicide?: No Suicidal Plan?: No Access to Means: No What has been your use of drugs/alcohol within the last 12 months?: NA How many times?: 0 Other Self Harm Risks: NA Triggers for Past Attempts: None known Intentional Self Injurious Behavior: None Risk to Others: Homicidal Ideation: No-Not Currently/Within Last 6 Months Thoughts of Harm to Others: No-Not Currently Present/Within Last 6 Months (wanted to harm staff at [facility]) Current Homicidal Intent: No Current Homicidal Plan: No Access  to Homicidal Means: No Identified Victim: NA History of harm to others?: No Assessment of Violence: On admission Violent Behavior Description: wanted to harm [facility] staff Does patient have access to weapons?: No Criminal Charges Pending?: No Does  patient have a court date: No Prior Inpatient Therapy: Prior Inpatient Therapy: No Prior Therapy Dates: NA Prior Therapy Facilty/Provider(s): NA Reason for Treatment: NA Prior Outpatient Therapy: Prior Outpatient Therapy: No Prior Therapy Dates: NA Prior Therapy Facilty/Provider(s): NA Reason for Treatment: NA Does patient have an ACCT team?: No Does patient have Intensive In-House Services?  : No Does patient have [facility] services? : No Does patient have P4CC services?: No  Past Medical History:  Past Medical History  Diagnosis Date  . Renal disorder   . Hypertension   . Diabetes mellitus without complication ([place])   . Thyroid disease   . Schizophrenia ([place])   . Family history of adverse reaction to anesthesia     Past Surgical History  Procedure Laterality Date  . Foot surgery     Family History:  Family History  Problem Relation Age of Onset  . Diabetes Father    Family Psychiatric  History: Unknown Social History:  History  Alcohol Use No     History  Drug Use No    Social History   Social History  . Marital Status: Divorced    Spouse Name: N/A  . Number of Children: N/A  . Years of Education: N/A   Social History Main Topics  . Smoking status: Never Smoker   . Smokeless tobacco: None  . Alcohol Use: No  . Drug Use: No  . Sexual Activity: Not Asked   Other Topics Concern  . None   Social History Narrative   Additional Social History:    Pain Medications: Pt denies Prescriptions: Pt denies Over the Counter: Pt denies History of alcohol / drug use?: No history of alcohol / drug abuse Longest period of sobriety (when/how long): NA                     Allergies:  No Known Allergies  Labs:  Results for orders placed or performed during the hospital encounter of 02/06/15 (from the past 48 hour(s))  Comprehensive metabolic panel     Status: Abnormal   Collection Time: 02/06/15  4:34 AM  Result Value Ref Range   Sodium 144 135 - 145  mmol/L   Potassium 4.9 3.5 - 5.1 mmol/L   Chloride 104 101 - 111 mmol/L   CO2 29 22 - 32 mmol/L   Glucose, Bld 123 (H) 65 - 99 mg/dL   BUN 26 (H) 6 - 20 mg/dL   Creatinine, Ser 1.16 (H) 0.44 - 1.00 mg/dL   Calcium 9.4 8.9 - 10.3 mg/dL   Total Protein 8.9 (H) 6.5 - 8.1 g/dL   Albumin 4.0 3.5 - 5.0 g/dL   AST 27 15 - 41 U/L   ALT 24 14 - 54 U/L   Alkaline Phosphatase 163 (H) 38 - 126 U/L   Total Bilirubin 0.4 0.3 - 1.2 mg/dL   GFR calc non Af Amer 53 (L) >60 mL/min   GFR calc Af Amer >60 >60 mL/min    Comment: (NOTE) The eGFR has been calculated using the CKD EPI equation. This calculation has not been validated in all clinical situations. eGFR's persistently <60 mL/min signify possible Chronic Kidney Disease.    Anion gap 11 5 - 15  Lipase, blood     Status:  None   Collection Time: 02/06/15  4:34 AM  Result Value Ref Range   Lipase 23 11 - 51 U/L  CBC with Differential     Status: Abnormal   Collection Time: 02/06/15  4:34 AM  Result Value Ref Range   WBC 8.6 4.0 - 10.5 K/uL   RBC 5.70 (H) 3.87 - 5.11 MIL/uL   Hemoglobin 13.1 12.0 - 15.0 g/dL   HCT 41.9 36.0 - 46.0 %   MCV 73.5 (L) 78.0 - 100.0 fL   MCH 23.0 (L) 26.0 - 34.0 pg   MCHC 31.3 30.0 - 36.0 g/dL   RDW 16.0 (H) 11.5 - 15.5 %   Platelets 275 150 - 400 K/uL   Neutrophils Relative % 67 %   Neutro Abs 5.8 1.7 - 7.7 K/uL   Lymphocytes Relative 23 %   Lymphs Abs 2.0 0.7 - 4.0 K/uL   Monocytes Relative 8 %   Monocytes Absolute 0.6 0.1 - 1.0 K/uL   Eosinophils Relative 1 %   Eosinophils Absolute 0.1 0.0 - 0.7 K/uL   Basophils Relative 1 %   Basophils Absolute 0.0 0.0 - 0.1 K/uL  I-stat troponin, ED     Status: None   Collection Time: 02/06/15  4:47 AM  Result Value Ref Range   Troponin i, poc 0.00 0.00 - 0.08 ng/mL   Comment 3            Comment: Due to the release kinetics of cTnI, a negative result within the first hours of the onset of symptoms does not rule out myocardial infarction with certainty. If  myocardial infarction is still suspected, repeat the test at appropriate intervals.   Urinalysis, Routine w reflex microscopic     Status: None   Collection Time: 02/06/15  5:36 AM  Result Value Ref Range   Color, Urine YELLOW YELLOW   APPearance CLEAR CLEAR   Specific Gravity, Urine 1.019 1.005 - 1.030   pH 5.5 5.0 - 8.0   Glucose, UA NEGATIVE NEGATIVE mg/dL   Hgb urine dipstick NEGATIVE NEGATIVE   Bilirubin Urine NEGATIVE NEGATIVE   Ketones, ur NEGATIVE NEGATIVE mg/dL   Protein, ur NEGATIVE NEGATIVE mg/dL   Nitrite NEGATIVE NEGATIVE   Leukocytes, UA NEGATIVE NEGATIVE    Comment: MICROSCOPIC NOT DONE ON URINES WITH NEGATIVE PROTEIN, BLOOD, LEUKOCYTES, NITRITE, OR GLUCOSE <1000 mg/dL.  Urine rapid drug screen (hosp performed)     Status: None   Collection Time: 02/06/15  5:36 AM  Result Value Ref Range   Opiates NONE DETECTED NONE DETECTED   Cocaine NONE DETECTED NONE DETECTED   Benzodiazepines NONE DETECTED NONE DETECTED   Amphetamines NONE DETECTED NONE DETECTED   Tetrahydrocannabinol NONE DETECTED NONE DETECTED   Barbiturates NONE DETECTED NONE DETECTED    Comment:        DRUG SCREEN FOR MEDICAL PURPOSES ONLY.  IF CONFIRMATION IS NEEDED FOR ANY PURPOSE, NOTIFY LAB WITHIN 5 DAYS.        LOWEST DETECTABLE LIMITS FOR URINE DRUG SCREEN Drug Class       Cutoff (ng/mL) Amphetamine      1000 Barbiturate      200 Benzodiazepine   888 Tricyclics       280 Opiates          300 Cocaine          300 THC              50     Current Facility-Administered Medications  Medication Dose Route Frequency  Provider Last Rate Last Dose  . LORazepam (ATIVAN) tablet 0.5 mg  0.5 mg Oral Q12H PRN Benjamine Mola, FNP       Current Outpatient Prescriptions  Medication Sig Dispense Refill  . acetaminophen (TYLENOL) 325 MG tablet Take 650 mg by mouth every 6 (six) hours as needed for moderate pain or headache.    . ARIPiprazole (ABILIFY) 2 MG tablet Take 1 tablet (2 mg total) by mouth daily.  30 tablet 3  . furosemide (LASIX) 40 MG tablet Take 40 mg by mouth daily.    Marland Kitchen glimepiride (AMARYL) 4 MG tablet Take 4 mg by mouth daily with breakfast.    . levothyroxine (SYNTHROID, LEVOTHROID) 125 MCG tablet Take 125 mcg by mouth daily before breakfast.    . lisinopril (PRINIVIL,ZESTRIL) 5 MG tablet Take 5 mg by mouth daily.    . metFORMIN (GLUCOPHAGE) 500 MG tablet Take by mouth 2 (two) times daily with a meal.    . potassium chloride SA (K-DUR,KLOR-CON) 20 MEQ tablet Take 20 mEq by mouth daily.    . risperiDONE (RISPERDAL M-TABS) 1 MG disintegrating tablet Take 1 tablet (1 mg total) by mouth at bedtime. 30 tablet 0  . amoxicillin-clavulanate (AUGMENTIN) 875-125 MG tablet Take 1 tablet by mouth every 12 (twelve) hours. (Patient not taking: Reported on 02/06/2015) 10 tablet 0    Musculoskeletal: Strength & Muscle Tone: decreased Gait & Station: not observed but has walker Patient leans: N/A  Psychiatric Specialty Exam: Review of Systems  Musculoskeletal:       Some impaired mobility, at rehab facility   Psychiatric/Behavioral: Positive for depression. Negative for suicidal ideas and hallucinations. The patient is nervous/anxious and has insomnia.   All other systems reviewed and are negative.   Blood pressure 132/72, pulse 80, temperature 97.7 F (36.5 C), temperature source Oral, resp. rate 18, SpO2 97 %.There is no weight on file to calculate BMI.  General Appearance: Casual and Fairly Groomed  Engineer, water::  Poor  Speech:  Clear and Coherent and Normal Rate  Volume:  Increased  Mood:  Euthymic  Affect:  Appropriate and Congruent  Thought Process:  Coherent and Goal Directed  Orientation:  Full (Time, Place, and Person)  Thought Content:  WDL  Suicidal Thoughts:  No  Homicidal Thoughts:  No  Memory:  Immediate;   Fair Recent;   Fair Remote;   Fair  Judgement:  Fair  Insight:  Fair  Psychomotor Activity:  Decreased  Concentration:  Good  Recall:  [employer] of  Knowledge:Fair  Language: Good  Akathisia:  No  Handed:    AIMS (if indicated):     Assets:  Communication Skills Desire for Improvement Resilience  ADL's:  Impaired  Cognition: WNL  Sleep:      Treatment Plan Summary: See below  Disposition:  -Discharge back to SNF for rehab purposes -Continue home meds -Rx for Ativan 0.66m q12h prn #14 (may refill at outpatient provider)  WBenjamine Mola FNP-BC 02/06/2015 12:14 PM Patient seen face-to-face for psychiatric evaluation, chart reviewed and case discussed with the physician extender and developed treatment plan. Reviewed the information documented and agree with the treatment plan. MCorena Pilgrim MD

## 2015-02-06 NOTE — Progress Notes (Signed)
CSW attempting calls to speak with family and facility as the proviCSW attempted telephone call to [facility], daughter, [phone] and Danniella Robben [phone]. CSW left messages for both daughters with name and contact number for return call. CSW also left message for the Selinda Eon, Child psychotherapist at [facility] [phone].  CSW received telephone call from Jovita Kussmaul, Social Worker regarding patient. CSW asked if patient is allowed to come back to there facility. She reports she will speak with the Director of Nursing and call CSW back.  CSW received telephone call back from Treasa School, Child psychotherapist. She stated she spoke with the the Director of Nursing and they are allowing patient to return back to their facility. CSW informed social worker that patient would be returning to their facility on today.       Elenore Paddy [phone] ED CSW 02/06/2015 11:37 AM

## 2015-02-06 NOTE — ED Notes (Signed)
Bed: FA21 Expected date:  Expected time:  Means of arrival:  Comments: rm 14

## 2015-02-06 NOTE — BH Assessment (Addendum)
Tele Assessment Note   Kirsten Young is an 55 y.o. female. Pt denies SI. Pt reports previous HI. According to the Pt, she wanted to shoot 1 of the staff at [facility] because "she did not feel that she is being treated nicely." Pt was recently diagnosed with schizophrenia. Pt is prescribed Risperdal. Pt lives at [facility]. 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 Pt walks with a walker but can complete all ADLs. Pt was poor historian.  Collateral contact: Pt's daughter Kirsten Young reports the following: Pt never had any mental health concerns until 5 years ago. Pt then began to have times of confusion and appeared oriented. Mrs. Joseph Art said that the Pt would soon return back to normal. Mrs. Joseph Art states that the Pt's behavior is worsening and she is becoming less and less oriented. The Pt just began living at [facility] a week ago. Mrs. Joseph Art states that she feels that the Risperdal worsened her mother's behavior. Mrs. Joseph Art also suspects early onset of Demenia or Alzhemiers but her mother has not been tested.   Writer consulted with Dr. Jannifer Franklin and Julieanne Cotton, NP. Per Dr. Jannifer Franklin and Julieanne Cotton, NP Pt does not meet inpatient criteria. Pt will be D/C back to rehab program.  Diagnosis:  F20.9 Schizophrenia  Past Medical History:  Past Medical History  Diagnosis Date  . Renal disorder   . Hypertension   . Diabetes mellitus without complication ([facility])   . Thyroid disease   . Schizophrenia ([facility])   . Family history of adverse reaction to anesthesia     Past Surgical History  Procedure Laterality Date  . Foot surgery      Family History:  Family History  Problem Relation Age of Onset  . Diabetes Father     Social History:  reports that she has never smoked. She does not have any smokeless tobacco history on file. She reports that she does not drink  alcohol or use illicit drugs.  Additional Social History:  Alcohol / Drug Use Pain Medications: Pt denies Prescriptions: Pt denies Over the Counter: Pt denies History of alcohol / drug use?: No history of alcohol / drug abuse Longest period of sobriety (when/how long): NA  CIWA: CIWA-Ar BP: 117/73 mmHg Pulse Rate: 74 COWS:    PATIENT STRENGTHS: (choose at least two) Average or above average intelligence Communication skills  Allergies: No Known Allergies  Home Medications:  (Not in a hospital admission)  OB/GYN Status:  No LMP recorded. Patient is postmenopausal.  General Assessment Data Location of Assessment: [facility] ED TTS Assessment: In system Is this a Tele or Face-to-Face Assessment?: Face-to-Face Is this an Initial Assessment or a Re-assessment for this encounter?: Initial Assessment Marital status: Single Maiden name: NA Is patient pregnant?: No Pregnancy Status: No Living Arrangements: Children Can pt return to current living arrangement?: Yes Admission Status: Voluntary Is patient capable of signing voluntary admission?: Yes Referral Source: Self/Family/Friend Insurance type: Medicare     Crisis Care Plan Living Arrangements: Children Legal Guardian: Other: (self) Name of Psychiatrist: Na Name of Therapist: NA  Education Status Is patient currently in school?: No Current Grade: NA Highest grade of school patient has completed: 9 Name of school: NA Contact person: NA  Risk to self with the past 6 months Suicidal Ideation: No Has patient been a risk to self within the past 6 months prior  to admission? : No Suicidal Intent: No Has patient had any suicidal intent within the past 6 months prior to admission? : No Is patient at risk for suicide?: No Suicidal Plan?: No Has patient had any suicidal plan within the past 6 months prior to admission? : No Access to Means: No What has been your use of drugs/alcohol within the last 12 months?: NA Previous  Attempts/Gestures: No How many times?: 0 Other Self Harm Risks: NA Triggers for Past Attempts: None known Intentional Self Injurious Behavior: None Family Suicide History: No Recent stressful life event(s): Other (Comment) (does not like rehab facility) Persecutory voices/beliefs?: No Depression: No Depression Symptoms:  (pt denies) Substance abuse history and/or treatment for substance abuse?: No Suicide prevention information given to non-admitted patients: Not applicable  Risk to Others within the past 6 months Homicidal Ideation: No-Not Currently/Within Last 6 Months Does patient have any lifetime risk of violence toward others beyond the six months prior to admission? : No Thoughts of Harm to Others: No-Not Currently Present/Within Last 6 Months (wanted to harm staff at [facility]) Current Homicidal Intent: No Current Homicidal Plan: No Access to Homicidal Means: No Identified Victim: NA History of harm to others?: No Assessment of Violence: On admission Violent Behavior Description: wanted to harm [facility] staff Does patient have access to weapons?: No Criminal Charges Pending?: No Does patient have a court date: No Is patient on probation?: No  Psychosis Hallucinations: None noted Delusions: None noted  Mental Status Report Appearance/Hygiene: Disheveled Eye Contact: Poor Motor Activity: Rigidity Speech: Logical/coherent Level of Consciousness: Alert Mood: Euthymic Affect: Appropriate to circumstance Anxiety Level: None Thought Processes: Coherent, Relevant Judgement: Impaired Orientation: Person, Place Obsessive Compulsive Thoughts/Behaviors: None  Cognitive Functioning Concentration: Normal Memory: Recent Intact, Remote Intact IQ: Average Insight: Poor Impulse Control: Poor Appetite: Fair Weight Loss: 0 Weight Gain: 0 Sleep: No Change Total Hours of Sleep: 8 Vegetative Symptoms: None  ADLScreening [facility] Assessment Services) Patient's  cognitive ability adequate to safely complete daily activities?: Yes Patient able to express need for assistance with ADLs?: Yes Independently performs ADLs?: No  Prior Inpatient Therapy Prior Inpatient Therapy: No Prior Therapy Dates: NA Prior Therapy Facilty/Provider(s): NA Reason for Treatment: NA  Prior Outpatient Therapy Prior Outpatient Therapy: No Prior Therapy Dates: NA Prior Therapy Facilty/Provider(s): NA Reason for Treatment: NA Does patient have an ACCT team?: No Does patient have Intensive In-House Services?  : No Does patient have [facility] services? : No Does patient have P4CC services?: No  ADL Screening (condition at time of admission) Patient's cognitive ability adequate to safely complete daily activities?: Yes Is the patient deaf or have difficulty hearing?: Yes Does the patient have difficulty seeing, even when wearing glasses/contacts?: Yes Does the patient have difficulty concentrating, remembering, or making decisions?: Yes Patient able to express need for assistance with ADLs?: Yes Does the patient have difficulty dressing or bathing?: Yes Independently performs ADLs?: No Communication: Dependent Dressing (OT): Dependent Grooming: Dependent Feeding: Dependent Bathing: Dependent Toileting: Dependent In/Out Bed: Dependent Walks in Home: Independent       Abuse/Neglect Assessment (Assessment to be complete while patient is alone) Physical Abuse: Denies Verbal Abuse: Denies Sexual Abuse: Denies Exploitation of patient/patient's resources: Denies Self-Neglect: Denies Values / Beliefs Cultural Requests During Hospitalization: None Spiritual Requests During Hospitalization: None   Advance Directives (For Healthcare) Does patient have an advance directive?: No Would patient like information on creating an advanced directive?: No - patient declined information    Additional Information 1:1 In Past  12 Months?: No CIRT Risk: No Elopement Risk:  No Does patient have medical clearance?: Yes     Disposition:  Disposition Initial Assessment Completed for this Encounter: Yes  Harshith Pursell D 02/06/2015 7:48 AM

## 2015-02-06 NOTE — BHH Suicide Risk Assessment (Cosign Needed)
    [facility] Discharge Suicide Risk Assessment   Principal Problem: Altered mental state Discharge Diagnoses:  Patient Active Problem List   Diagnosis Date Noted  . Altered mental state [R41.82] 12/19/2013    Priority: High  . Psychosis [F29] 01/30/2015  . Pneumonia [J18.9] 01/26/2015  . Major depressive disorder, recurrent episode, severe, with psychotic behavior ([facility]) [F33.3] 03/12/2014  . Chest pain due to psychological stress [R07.9, F43.9]   . Hypothyroidism [E03.9] 12/19/2013  . Hypertension [I10] 12/19/2013  . Diabetes mellitus without complication ([facility]CC) [E11.9] 12/19/2013  . Depression [F32.9] 12/19/2013  . Fever [R50.9] 12/19/2013  . Acute encephalopathy [G93.40] 12/19/2013  . Acute sinusitis [J01.90] 12/19/2013    Total Time spent with patient: 45 minutes  Musculoskeletal: See note  Psychiatric Specialty Exam:   Blood pressure 132/72, pulse 80, temperature 97.7 F (36.5 C), temperature source Oral, resp. rate 18, SpO2 97 %.There is no weight on file to calculate BMI.  See notes   Mental Status Per Nursing Assessment::   On Admission:     Demographic Factors:  None pertinent  Loss Factors: Decrease in vocational status and Decline in physical health  Historical Factors: Impulsivity  Risk Reduction Factors:   Positive social support  Continued Clinical Symptoms:  Severe Anxiety and/or Agitation Depression:   Hopelessness Insomnia  Cognitive Features That Contribute To Risk:  Polarized thinking    Suicide Risk:  Minimal: No identifiable suicidal ideation.  Patients presenting with no risk factors but with morbid ruminations; may be classified as minimal risk based on the severity of the depressive symptoms    Plan Of Care/Follow-up recommendations:  Activity:  As tolerated Diet:  Heart healthy with low sodium  Beau Fanny, FNP 02/06/2015, 12:21 PM

## 2015-02-19 DEATH — deceased

## 2015-02-24 ENCOUNTER — Emergency Department (HOSPITAL_COMMUNITY): Payer: Medicare Other

## 2015-02-24 ENCOUNTER — Inpatient Hospital Stay (HOSPITAL_COMMUNITY)
Admission: EM | Admit: 2015-02-24 | Discharge: 2015-03-19 | DRG: 871 | Disposition: E | Payer: Medicare Other | Attending: Emergency Medicine | Admitting: Emergency Medicine

## 2015-02-24 ENCOUNTER — Inpatient Hospital Stay (HOSPITAL_COMMUNITY): Payer: Medicare Other

## 2015-02-24 ENCOUNTER — Encounter (HOSPITAL_COMMUNITY): Payer: Self-pay | Admitting: *Deleted

## 2015-02-24 DIAGNOSIS — E11649 Type 2 diabetes mellitus with hypoglycemia without coma: Secondary | ICD-10-CM | POA: Diagnosis present

## 2015-02-24 DIAGNOSIS — F209 Schizophrenia, unspecified: Secondary | ICD-10-CM | POA: Diagnosis present

## 2015-02-24 DIAGNOSIS — N170 Acute kidney failure with tubular necrosis: Secondary | ICD-10-CM | POA: Diagnosis present

## 2015-02-24 DIAGNOSIS — R001 Bradycardia, unspecified: Secondary | ICD-10-CM | POA: Diagnosis present

## 2015-02-24 DIAGNOSIS — E039 Hypothyroidism, unspecified: Secondary | ICD-10-CM | POA: Diagnosis present

## 2015-02-24 DIAGNOSIS — E874 Mixed disorder of acid-base balance: Secondary | ICD-10-CM | POA: Diagnosis present

## 2015-02-24 DIAGNOSIS — Z22322 Carrier or suspected carrier of Methicillin resistant Staphylococcus aureus: Secondary | ICD-10-CM

## 2015-02-24 DIAGNOSIS — R404 Transient alteration of awareness: Secondary | ICD-10-CM | POA: Diagnosis not present

## 2015-02-24 DIAGNOSIS — Z515 Encounter for palliative care: Secondary | ICD-10-CM | POA: Diagnosis not present

## 2015-02-24 DIAGNOSIS — Z833 Family history of diabetes mellitus: Secondary | ICD-10-CM | POA: Diagnosis not present

## 2015-02-24 DIAGNOSIS — N179 Acute kidney failure, unspecified: Secondary | ICD-10-CM | POA: Diagnosis not present

## 2015-02-24 DIAGNOSIS — E860 Dehydration: Secondary | ICD-10-CM | POA: Diagnosis present

## 2015-02-24 DIAGNOSIS — D509 Iron deficiency anemia, unspecified: Secondary | ICD-10-CM | POA: Diagnosis present

## 2015-02-24 DIAGNOSIS — A419 Sepsis, unspecified organism: Principal | ICD-10-CM | POA: Diagnosis present

## 2015-02-24 DIAGNOSIS — Z7984 Long term (current) use of oral hypoglycemic drugs: Secondary | ICD-10-CM

## 2015-02-24 DIAGNOSIS — R4182 Altered mental status, unspecified: Secondary | ICD-10-CM | POA: Diagnosis present

## 2015-02-24 DIAGNOSIS — G9340 Encephalopathy, unspecified: Secondary | ICD-10-CM

## 2015-02-24 DIAGNOSIS — R34 Anuria and oliguria: Secondary | ICD-10-CM | POA: Diagnosis not present

## 2015-02-24 DIAGNOSIS — Z66 Do not resuscitate: Secondary | ICD-10-CM | POA: Diagnosis not present

## 2015-02-24 DIAGNOSIS — I959 Hypotension, unspecified: Secondary | ICD-10-CM | POA: Diagnosis present

## 2015-02-24 DIAGNOSIS — J019 Acute sinusitis, unspecified: Secondary | ICD-10-CM | POA: Diagnosis present

## 2015-02-24 DIAGNOSIS — J151 Pneumonia due to Pseudomonas: Secondary | ICD-10-CM | POA: Diagnosis present

## 2015-02-24 DIAGNOSIS — R4020 Unspecified coma: Secondary | ICD-10-CM | POA: Diagnosis present

## 2015-02-24 DIAGNOSIS — Z79899 Other long term (current) drug therapy: Secondary | ICD-10-CM

## 2015-02-24 DIAGNOSIS — J9600 Acute respiratory failure, unspecified whether with hypoxia or hypercapnia: Secondary | ICD-10-CM | POA: Diagnosis not present

## 2015-02-24 DIAGNOSIS — E870 Hyperosmolality and hypernatremia: Secondary | ICD-10-CM | POA: Diagnosis present

## 2015-02-24 DIAGNOSIS — R680 Hypothermia, not associated with low environmental temperature: Secondary | ICD-10-CM | POA: Diagnosis present

## 2015-02-24 DIAGNOSIS — Z978 Presence of other specified devices: Secondary | ICD-10-CM

## 2015-02-24 DIAGNOSIS — I10 Essential (primary) hypertension: Secondary | ICD-10-CM | POA: Diagnosis present

## 2015-02-24 DIAGNOSIS — Z452 Encounter for adjustment and management of vascular access device: Secondary | ICD-10-CM

## 2015-02-24 DIAGNOSIS — R40244 Other coma, without documented Glasgow coma scale score, or with partial score reported, unspecified time: Secondary | ICD-10-CM | POA: Diagnosis not present

## 2015-02-24 LAB — I-STAT CG4 LACTIC ACID, ED
Lactic Acid, Venous: 1.57 mmol/L (ref 0.5–2.0)
Lactic Acid, Venous: 0.7 mmol/L (ref 0.5–2.0)

## 2015-02-24 LAB — IRON AND TIBC
Iron: 78 ug/dL (ref 28–170)
Saturation Ratios: 35 % — ABNORMAL HIGH (ref 10.4–31.8)
TIBC: 223 ug/dL — ABNORMAL LOW (ref 250–450)
UIBC: 145 ug/dL

## 2015-02-24 LAB — CREATININE, URINE, RANDOM: CREATININE, URINE: 73.69 mg/dL

## 2015-02-24 LAB — RAPID URINE DRUG SCREEN, HOSP PERFORMED
Amphetamines: NOT DETECTED
BARBITURATES: NOT DETECTED
Benzodiazepines: POSITIVE — AB
Cocaine: NOT DETECTED
Opiates: NOT DETECTED
TETRAHYDROCANNABINOL: NOT DETECTED

## 2015-02-24 LAB — CBC WITH DIFFERENTIAL/PLATELET
Basophils Absolute: 0 10*3/uL (ref 0.0–0.1)
Basophils Relative: 0 %
Eosinophils Absolute: 0 10*3/uL (ref 0.0–0.7)
Eosinophils Relative: 1 %
HCT: 33 % — ABNORMAL LOW (ref 36.0–46.0)
Hemoglobin: 10.6 g/dL — ABNORMAL LOW (ref 12.0–15.0)
Lymphocytes Relative: 12 %
Lymphs Abs: 0.6 10*3/uL — ABNORMAL LOW (ref 0.7–4.0)
MCH: 23.2 pg — ABNORMAL LOW (ref 26.0–34.0)
MCHC: 32.1 g/dL (ref 30.0–36.0)
MCV: 72.2 fL — ABNORMAL LOW (ref 78.0–100.0)
Monocytes Absolute: 0.3 10*3/uL (ref 0.1–1.0)
Monocytes Relative: 5 %
Neutro Abs: 4.4 10*3/uL (ref 1.7–7.7)
Neutrophils Relative %: 82 %
Platelets: 178 10*3/uL (ref 150–400)
RBC: 4.57 MIL/uL (ref 3.87–5.11)
RDW: 17.3 % — ABNORMAL HIGH (ref 11.5–15.5)
WBC: 5.3 10*3/uL (ref 4.0–10.5)

## 2015-02-24 LAB — TSH: TSH: 0.085 u[IU]/mL — ABNORMAL LOW (ref 0.350–4.500)

## 2015-02-24 LAB — RETICULOCYTES
RBC.: 4.57 MIL/uL (ref 3.87–5.11)
RETIC COUNT ABSOLUTE: 64 10*3/uL (ref 19.0–186.0)
RETIC CT PCT: 1.4 % (ref 0.4–3.1)

## 2015-02-24 LAB — URINALYSIS, ROUTINE W REFLEX MICROSCOPIC
Glucose, UA: NEGATIVE mg/dL
Hgb urine dipstick: NEGATIVE
Ketones, ur: 15 mg/dL — AB
Leukocytes, UA: NEGATIVE
Nitrite: NEGATIVE
Protein, ur: NEGATIVE mg/dL
Specific Gravity, Urine: 1.015 (ref 1.005–1.030)
pH: 5 (ref 5.0–8.0)

## 2015-02-24 LAB — POCT I-STAT 3, ART BLOOD GAS (G3+)
Acid-base deficit: 7 mmol/L — ABNORMAL HIGH (ref 0.0–2.0)
Bicarbonate: 15.9 mEq/L — ABNORMAL LOW (ref 20.0–24.0)
O2 Saturation: 100 %
PCO2 ART: 22.3 mmHg — AB (ref 35.0–45.0)
PH ART: 7.452 — AB (ref 7.350–7.450)
TCO2: 17 mmol/L (ref 0–100)
pO2, Arterial: 175 mmHg — ABNORMAL HIGH (ref 80.0–100.0)

## 2015-02-24 LAB — COMPREHENSIVE METABOLIC PANEL
ALT: 119 U/L — ABNORMAL HIGH (ref 14–54)
AST: 114 U/L — ABNORMAL HIGH (ref 15–41)
Albumin: 3.2 g/dL — ABNORMAL LOW (ref 3.5–5.0)
Alkaline Phosphatase: 132 U/L — ABNORMAL HIGH (ref 38–126)
Anion gap: 17 — ABNORMAL HIGH (ref 5–15)
BUN: 95 mg/dL — ABNORMAL HIGH (ref 6–20)
CO2: 19 mmol/L — ABNORMAL LOW (ref 22–32)
Calcium: 8.3 mg/dL — ABNORMAL LOW (ref 8.9–10.3)
Chloride: 112 mmol/L — ABNORMAL HIGH (ref 101–111)
Creatinine, Ser: 3.18 mg/dL — ABNORMAL HIGH (ref 0.44–1.00)
GFR calc Af Amer: 18 mL/min — ABNORMAL LOW (ref >60)
GFR calc non Af Amer: 16 mL/min — ABNORMAL LOW (ref >60)
Glucose, Bld: 108 mg/dL — ABNORMAL HIGH (ref 65–99)
Potassium: 5.1 mmol/L (ref 3.5–5.1)
Sodium: 148 mmol/L — ABNORMAL HIGH (ref 135–145)
Total Bilirubin: 0.8 mg/dL (ref 0.3–1.2)
Total Protein: 7.2 g/dL (ref 6.5–8.1)

## 2015-02-24 LAB — VITAMIN B12: VITAMIN B 12: 1731 pg/mL — AB (ref 180–914)

## 2015-02-24 LAB — FOLATE: FOLATE: 7.4 ng/mL (ref >5.9)

## 2015-02-24 LAB — MRSA PCR SCREENING: MRSA by PCR: POSITIVE — AB

## 2015-02-24 LAB — FERRITIN: Ferritin: 508 ng/mL — ABNORMAL HIGH (ref 11–307)

## 2015-02-24 LAB — CBG MONITORING, ED: Glucose-Capillary: 97 mg/dL (ref 65–99)

## 2015-02-24 LAB — T4, FREE: Free T4: 1.4 ng/dL — ABNORMAL HIGH (ref 0.61–1.12)

## 2015-02-24 LAB — SODIUM, URINE, RANDOM: Sodium, Ur: 65 mmol/L

## 2015-02-24 LAB — GLUCOSE, CAPILLARY
Glucose-Capillary: 30 mg/dL — CL (ref 65–99)
Glucose-Capillary: 138 mg/dL — ABNORMAL HIGH (ref 65–99)
Glucose-Capillary: 26 mg/dL — CL (ref 65–99)
Glucose-Capillary: 97 mg/dL (ref 65–99)

## 2015-02-24 LAB — LACTIC ACID, PLASMA: Lactic Acid, Venous: 0.8 mmol/L (ref 0.5–2.0)

## 2015-02-24 LAB — PROCALCITONIN: Procalcitonin: 0.99 ng/mL

## 2015-02-24 LAB — TROPONIN I: Troponin I: <0.03 ng/mL (ref <0.031)

## 2015-02-24 LAB — CORTISOL: Cortisol, Plasma: 14 ug/dL

## 2015-02-24 MED ORDER — VANCOMYCIN HCL IN DEXTROSE 1-5 GM/200ML-% IV SOLN: 1000.0000 mg | Freq: Once | INTRAVENOUS | Status: DC

## 2015-02-24 MED ORDER — FENTANYL BOLUS VIA INFUSION
50.0000 ug | INTRAVENOUS | Status: DC | PRN
  Filled 2015-02-24: qty 50

## 2015-02-24 MED ORDER — ASPIRIN 300 MG RE SUPP: 300.0000 mg | RECTAL | Status: DC

## 2015-02-24 MED ORDER — ALBUTEROL SULFATE (2.5 MG/3ML) 0.083% IN NEBU: 2.5000 mg | INHALATION_SOLUTION | RESPIRATORY_TRACT | Status: DC | PRN

## 2015-02-24 MED ORDER — MIDAZOLAM HCL 2 MG/2ML IJ SOLN
INTRAMUSCULAR | Status: AC | PRN
  Administered 2015-02-24: 2 mg via INTRAVENOUS

## 2015-02-24 MED ORDER — SODIUM CHLORIDE 0.9 % IV SOLN
250.0000 mL | INTRAVENOUS | Status: DC | PRN
  Administered 2015-02-26: 250 mL via INTRAVENOUS

## 2015-02-24 MED ORDER — SODIUM CHLORIDE 0.9 % IV BOLUS (SEPSIS)
500.0000 mL | Freq: Once | INTRAVENOUS | Status: AC
  Administered 2015-02-24: 500 mL via INTRAVENOUS

## 2015-02-24 MED ORDER — ASPIRIN 81 MG PO CHEW: 324.0000 mg | CHEWABLE_TABLET | ORAL | Status: DC

## 2015-02-24 MED ORDER — INSULIN ASPART 100 UNIT/ML ~~LOC~~ SOLN
0.0000 [IU] | SUBCUTANEOUS | Status: DC
  Administered 2015-02-25 (×2): 3 [IU] via SUBCUTANEOUS
  Administered 2015-02-25: 2 [IU] via SUBCUTANEOUS

## 2015-02-24 MED ORDER — HYDROCORTISONE NA SUCCINATE PF 100 MG IJ SOLR
50.0000 mg | Freq: Four times a day (QID) | INTRAMUSCULAR | Status: DC
  Administered 2015-02-24 – 2015-02-25 (×4): 50 mg via INTRAVENOUS
  Filled 2015-02-24: qty 1
  Filled 2015-02-24 (×3): qty 2
  Filled 2015-02-24 (×3): qty 1

## 2015-02-24 MED ORDER — ANTISEPTIC ORAL RINSE SOLUTION (CORINZ)
7.0000 mL | Freq: Four times a day (QID) | OROMUCOSAL | Status: DC
  Administered 2015-02-25 (×3): 7 mL via OROMUCOSAL

## 2015-02-24 MED ORDER — SODIUM CHLORIDE 0.9 % IV BOLUS (SEPSIS)
1000.0000 mL | Freq: Once | INTRAVENOUS | Status: AC
  Administered 2015-02-24: 1000 mL via INTRAVENOUS

## 2015-02-24 MED ORDER — FAMOTIDINE IN NACL 20-0.9 MG/50ML-% IV SOLN
20.0000 mg | Freq: Two times a day (BID) | INTRAVENOUS | Status: DC
  Administered 2015-02-24 – 2015-02-25 (×2): 20 mg via INTRAVENOUS
  Filled 2015-02-24 (×3): qty 50

## 2015-02-24 MED ORDER — MIDAZOLAM HCL 2 MG/2ML IJ SOLN
2.0000 mg | INTRAMUSCULAR | Status: DC | PRN
  Administered 2015-02-24: 2 mg via INTRAVENOUS
  Filled 2015-02-24: qty 2

## 2015-02-24 MED ORDER — VANCOMYCIN HCL 10 G IV SOLR: 1500.0000 mg | INTRAVENOUS | Status: DC

## 2015-02-24 MED ORDER — MIDAZOLAM HCL 2 MG/2ML IJ SOLN: 2.0000 mg | INTRAMUSCULAR | Status: DC | PRN

## 2015-02-24 MED ORDER — FENTANYL CITRATE (PF) 100 MCG/2ML IJ SOLN
INTRAMUSCULAR | Status: AC
  Filled 2015-02-24: qty 2

## 2015-02-24 MED ORDER — DEXTROSE 50 % IV SOLN
INTRAVENOUS | Status: AC
  Administered 2015-02-24: 18:00:00
  Filled 2015-02-24: qty 50

## 2015-02-24 MED ORDER — FENTANYL CITRATE (PF) 100 MCG/2ML IJ SOLN: 100.0000 ug | INTRAMUSCULAR | Status: DC | PRN

## 2015-02-24 MED ORDER — ETOMIDATE 2 MG/ML IV SOLN
INTRAVENOUS | Status: AC | PRN
  Administered 2015-02-24: 20 mg via INTRAVENOUS

## 2015-02-24 MED ORDER — FENTANYL CITRATE (PF) 100 MCG/2ML IJ SOLN
50.0000 ug | Freq: Once | INTRAMUSCULAR | Status: AC
  Administered 2015-02-24: 50 ug via INTRAVENOUS
  Filled 2015-02-24: qty 2

## 2015-02-24 MED ORDER — MIDAZOLAM HCL 2 MG/2ML IJ SOLN
INTRAMUSCULAR | Status: AC
  Administered 2015-02-24: 2 mg via INTRAVENOUS
  Filled 2015-02-24: qty 2

## 2015-02-24 MED ORDER — HEPARIN SODIUM (PORCINE) 5000 UNIT/ML IJ SOLN
5000.0000 [IU] | Freq: Three times a day (TID) | INTRAMUSCULAR | Status: DC
  Administered 2015-02-24 – 2015-02-25 (×3): 5000 [IU] via SUBCUTANEOUS
  Filled 2015-02-24 (×5): qty 1

## 2015-02-24 MED ORDER — VITAL HIGH PROTEIN PO LIQD
1000.0000 mL | ORAL | Status: DC
  Administered 2015-02-25: 1000 mL
  Administered 2015-02-25: 06:00:00
  Administered 2015-02-25: 1000 mL
  Filled 2015-02-24: qty 1000

## 2015-02-24 MED ORDER — ONDANSETRON HCL 4 MG/2ML IJ SOLN: 4.0000 mg | Freq: Four times a day (QID) | INTRAMUSCULAR | Status: DC | PRN

## 2015-02-24 MED ORDER — NOREPINEPHRINE BITARTRATE 1 MG/ML IV SOLN
0.0000 ug/min | INTRAVENOUS | Status: DC
  Administered 2015-02-24: 16 ug/min via INTRAVENOUS
  Administered 2015-02-24: 15 ug/min via INTRAVENOUS
  Administered 2015-02-25: 10 ug/min via INTRAVENOUS
  Administered 2015-02-25: 16 ug/min via INTRAVENOUS
  Filled 2015-02-24 (×3): qty 4

## 2015-02-24 MED ORDER — BISACODYL 10 MG RE SUPP: 10.0000 mg | Freq: Every day | RECTAL | Status: DC | PRN

## 2015-02-24 MED ORDER — MIDAZOLAM HCL 2 MG/2ML IJ SOLN
2.0000 mg | Freq: Once | INTRAMUSCULAR | Status: AC
  Administered 2015-02-24: 2 mg via INTRAVENOUS

## 2015-02-24 MED ORDER — PIPERACILLIN-TAZOBACTAM 3.375 G IVPB
3.3750 g | Freq: Three times a day (TID) | INTRAVENOUS | Status: DC
  Administered 2015-02-24 – 2015-02-25 (×3): 3.375 g via INTRAVENOUS
  Filled 2015-02-24 (×5): qty 50

## 2015-02-24 MED ORDER — CHLORHEXIDINE GLUCONATE 0.12% ORAL RINSE (MEDLINE KIT)
15.0000 mL | Freq: Two times a day (BID) | OROMUCOSAL | Status: DC
  Administered 2015-02-24 – 2015-02-25 (×2): 15 mL via OROMUCOSAL

## 2015-02-24 MED ORDER — SODIUM CHLORIDE 0.9 % IV SOLN
INTRAVENOUS | Status: DC
  Administered 2015-02-24: 50 mL/h via INTRAVENOUS

## 2015-02-24 MED ORDER — MIDAZOLAM HCL 2 MG/2ML IJ SOLN
2.0000 mg | INTRAMUSCULAR | Status: DC | PRN
  Administered 2015-02-24 – 2015-02-25 (×3): 2 mg via INTRAVENOUS
  Filled 2015-02-24 (×4): qty 2

## 2015-02-24 MED ORDER — PIPERACILLIN-TAZOBACTAM 3.375 G IVPB 30 MIN
3.3750 g | Freq: Once | INTRAVENOUS | Status: AC
  Administered 2015-02-24: 3.375 g via INTRAVENOUS
  Filled 2015-02-24: qty 50

## 2015-02-24 MED ORDER — SODIUM CHLORIDE 0.9 % IV SOLN
25.0000 ug/h | INTRAVENOUS | Status: DC
  Administered 2015-02-24: 100 ug/h via INTRAVENOUS
  Administered 2015-02-25: 150 ug/h via INTRAVENOUS
  Filled 2015-02-24 (×2): qty 50

## 2015-02-24 MED ORDER — PIPERACILLIN-TAZOBACTAM 3.375 G IVPB
3.3750 g | Freq: Three times a day (TID) | INTRAVENOUS | Status: DC
  Filled 2015-02-24 (×2): qty 50

## 2015-02-24 MED ORDER — VANCOMYCIN HCL 10 G IV SOLR
2000.0000 mg | Freq: Once | INTRAVENOUS | Status: AC
  Administered 2015-02-24: 2000 mg via INTRAVENOUS
  Filled 2015-02-24: qty 2000

## 2015-02-24 MED ORDER — DEXTROSE 5 % IV SOLN
0.0000 ug/min | Freq: Once | INTRAVENOUS | Status: AC
  Administered 2015-02-24: 5 ug/min via INTRAVENOUS
  Filled 2015-02-24: qty 4

## 2015-02-24 MED ORDER — FENTANYL CITRATE (PF) 100 MCG/2ML IJ SOLN
100.0000 ug | INTRAMUSCULAR | Status: DC | PRN
  Administered 2015-02-24: 100 ug via INTRAVENOUS
  Filled 2015-02-24: qty 2

## 2015-02-24 MED ORDER — FENTANYL CITRATE (PF) 100 MCG/2ML IJ SOLN
INTRAMUSCULAR | Status: AC | PRN
  Administered 2015-02-24: 100 ug via INTRAVENOUS

## 2015-02-24 NOTE — ED Notes (Signed)
Attempted report 

## 2015-02-24 NOTE — Procedures (Signed)
Intubation Procedure Note DAQUISHA CLERMONT [id number] 02/21/1960  Procedure: Intubation Indications: Airway protection and maintenance  Procedure Details Consent: Risks of procedure as well as the alternatives and risks of each were explained to the (patient/caregiver).  Consent for procedure obtained. Time Out: Verified patient identification, verified procedure, site/side was marked, verified correct patient position, special equipment/implants available, medications/allergies/relevent history reviewed, required imaging and test results available.  Performed  Meds given: fentanyl , versed , etomidate   Maximum sterile technique was used including cap, gloves, gown, hand hygiene and mask.  MAC-4 Glidescope.   7.5 ETT placed under DL with Mac-4 Glidescope by Shirlyn Goltz, NP Student under my direct supervision. Position confirmed by direct visualization, ETCO2 and auscultation.     Evaluation Hemodynamic Status: BP stable throughout; O2 sats: transiently fell during during procedure to 86%, quickly improved with bag-mask ventilation.  Patient's Current Condition: stable Complications: No apparent complications Patient did tolerate procedure well. Chest X-ray ordered to verify placement.  CXR: pending.   Attending Note:  I participated in the entire procedure including administration of rapid sequence sedation, visualization of glottis with glidescope and also tube placement. No apparent complications. CXR is pending now. Vent orders to be entered now.   Levy Pupa, MD, PhD 2015-03-11, 3:46 PM [facility] [phone] or if no answer [phone]

## 2015-02-24 NOTE — Progress Notes (Signed)
Pharmacy Antibiotic Note  Kirsten Young is a 55 y.o. female admitted on 2015-03-19 with sepsis.  Pharmacy has been consulted for vancomycin and zosyn dosing. Temperature not documented but pt requiring bear hugger. WBC is pending and lactic acid is 1.57. Scr is elevated at 3.18, which is above her baseline.   Plan: - Vancomycin 2gm IV x 1 then  IV Q48H - Zosyn 3.375gm IV Q8H (4 hr inf) - F/u renal fxn, C&S, clinical status and trough at SS  Height:  (180.3 cm) Weight: 241 lb (109.317 kg) IBW/kg (Calculated) : 70.8  No data recorded.   Recent Labs Lab 03-19-2015 1000 03/19/2015 1024  CREATININE 3.18*  --   LATICACIDVEN  --  1.57    Estimated Creatinine Clearance: 28.2 mL/min (by C-G formula based on Cr of 3.18).    No Known Allergies  Antimicrobials this admission: Vanc 2/6>> Zosyn 2/6>>  Dose adjustments this admission:   Microbiology results: Pending  Thank you for allowing pharmacy to be a part of this patient's care.  Kirsten Young, Kirsten Young 2015/03/19 10:52 AM

## 2015-02-24 NOTE — ED Notes (Signed)
[facility] notified on code sepsis.  

## 2015-02-24 NOTE — Progress Notes (Signed)
CBG 26 on arrival to . Hypoglycemia protocol initiated, i amp D50 given IV, Elink Notified. Repeat CBG 138. Will continue to monitor.

## 2015-02-24 NOTE — Procedures (Signed)
Central Venous Catheter Insertion Procedure Note TANGELA DOLLIVER [id number] [birth year]  Procedure: Insertion of Central Venous Catheter Indications: Assessment of intravascular volume, Drug and/or fluid administration and Frequent blood sampling  Procedure Details Consent: Risks of procedure as well as the alternatives and risks of each were explained to the (patient/caregiver).  Consent for procedure obtained. Time Out: Verified patient identification, verified procedure, site/side was marked, verified correct patient position, special equipment/implants available, medications/allergies/relevent history reviewed, required imaging and test results available.  Performed  Maximum sterile technique was used including antiseptics, cap, gloves, gown, hand hygiene, mask and sheet. Skin prep: Chlorhexidine; local anesthetic administered A antimicrobial bonded/coated triple lumen catheter was placed in the right internal jugular vein using the Seldinger technique. Ultrasound guidance used.Yes.   Catheter placed to 15 cm. Blood aspirated via all 3 ports and then flushed x 3. Line sutured x 2 and dressing applied.  Evaluation Blood flow good Complications: No apparent complications Patient did tolerate procedure well. Chest X-ray ordered to verify placement.  CXR: pending.  Joneen Roach, AGACNP-BC [facility] Pager [phone] or [phone]  Mar 26, 2015   Levy Pupa, MD, PhD 2015/03/26, 3:22 PM [facility] [phone] or if no answer [phone]

## 2015-02-24 NOTE — ED Notes (Signed)
Critical Care at bedside.  

## 2015-02-24 NOTE — Progress Notes (Signed)
Sputum sample obtained and sent to lab by RT. 

## 2015-02-24 NOTE — Progress Notes (Signed)
eLink Physician-Brief Progress Note Patient Name: Kirsten Young DOB: [birth year] MRN: [id number]   Date of Service  02/25/2015  HPI/Events of Note  Cortisol 14.  On pressors.  ABG with respiratory alkalosis and metabolic acidosis.   eICU Interventions  Add solu cortef.  Decrease RR to 16.      Intervention Category Major Interventions: Other:  Ricci Paff 02-25-15, 7:18 PM

## 2015-02-24 NOTE — ED Notes (Signed)
Canary Brim confirmed  Proper palcement of et tube and central line.

## 2015-02-24 NOTE — ED Notes (Signed)
Accompanied pt to CT 

## 2015-02-24 NOTE — ED Notes (Signed)
Pt brought here from [facility] via [facility] for altered mental status.  When EMS arrived pt was on floor mattress for frequent falls from bed.  Staff states pt was having a "seizure" - she was moving her arms and legs, trying to grab things out of the air.  AM staff states night staff had reported pt was normal (dementia, but pt can have conversation).  Pt presently making nonsensecle movements and unable to form any words. CBG 69 per staff, pt was given IM glucagon.  EMS cbg 86.  Recently tx for UTI/SEPSIS, but pt has been refusing her antibiotics.

## 2015-02-24 NOTE — ED Provider Notes (Signed)
CSN: [id number]     Arrival date & time 2015/03/04  0915 History   First MD Initiated Contact with Patient 2015/03/04 [phone]     Chief Complaint  Patient presents with  . Altered Mental Status     (Consider location/radiation/quality/duration/timing/severity/associated sxs/prior Treatment) HPI   Kirsten Young brought in by EMS for evaluation of altered mental status. Apparently staff told them that night staff reported she was "normal" although could not provide specific time. On EMS arrival the patient was on a floor and had seemingly random jerking movement of extremities. Initial glucose in 60s and given IM glucagon. Repeat glucose in 80s.   On arrival to ED, patient shaking which I suspect is her just shivering. Occasionally moaning. Nonverbal. Not following commands, but moving all extremities spontaneously. Per review of records, was treated for pneumonia during hospitalization last month. Baseline visual and hearing impairment. Walks with walker at baseline and performs ADLs. Reportedly no mental health issues up until about 5 years ago and has since had waxing/waning but progressive confusion. Recently diagnosed schizophrenia?   Past Medical History  Diagnosis Date  . Renal disorder   . Hypertension   . Diabetes mellitus without complication (HCC)   . Thyroid disease   . Schizophrenia (HCC)   . Family history of adverse reaction to anesthesia    Past Surgical History  Procedure Laterality Date  . Foot surgery     Family History  Problem Relation Age of Onset  . Diabetes Father    Social History  Substance Use Topics  . Smoking status: Never Smoker   . Smokeless tobacco: None  . Alcohol Use: No   OB History    No data available     Review of Systems  Level 5 caveat because of confusion.   Allergies  Review of patient's allergies indicates no known allergies.  Home Medications   Prior to Admission medications   Medication Sig Start Date End Date Taking? Authorizing Provider   ARIPiprazole (ABILIFY) 2 MG tablet Take 1 tablet (2 mg total) by mouth daily. 02/06/15   Beau Fanny, FNP  furosemide (LASIX) 40 MG tablet Take 1 tablet (40 mg total) by mouth daily. 02/06/15   Beau Fanny, FNP  glimepiride (AMARYL) 4 MG tablet Take 1 tablet (4 mg total) by mouth daily with breakfast. 02/06/15   Beau Fanny, FNP  levothyroxine (SYNTHROID, LEVOTHROID) 125 MCG tablet Take 1 tablet (125 mcg total) by mouth daily before breakfast. 02/06/15   Beau Fanny, FNP  lisinopril (PRINIVIL,ZESTRIL) 5 MG tablet Take 1 tablet (5 mg total) by mouth daily. 02/06/15   Beau Fanny, FNP  LORazepam (ATIVAN) 0.5 MG tablet Take 1 tablet (0.5 mg total) by mouth every 12 (twelve) hours as needed (severe agitation). 02/06/15   Beau Fanny, FNP  metFORMIN (GLUCOPHAGE) 500 MG tablet Take by mouth 2 (two) times daily with a meal.    Historical Provider, MD  potassium chloride SA (K-DUR,KLOR-CON) 20 MEQ tablet Take 1 tablet (20 mEq total) by mouth daily. 02/06/15   Everardo All Withrow, FNP   BP 80/47 mmHg  Pulse 78  Temp(Src)   Resp 23  Ht  (1.803 m)  Wt 241 lb (109.317 kg)  BMI 33.63 kg/m2  SpO2 100% Physical Exam  Constitutional: She appears well-developed and well-nourished.  HENT:  Head: Normocephalic and atraumatic.  Eyes: Conjunctivae are normal. Pupils are equal, round, and reactive to light. Right eye exhibits no discharge. Left eye exhibits no discharge.  Neck: Neck supple.  No nuchal rigidty  Cardiovascular: Normal rate, regular rhythm and normal heart sounds.  Exam reveals no gallop and no friction rub.   No murmur heard. Pulmonary/Chest: Effort normal and breath sounds normal. No respiratory distress.  Abdominal: Soft. She exhibits no distension. There is no tenderness.  Musculoskeletal: She exhibits no edema or tenderness.  Neurological:  Laying in bed. Intermittent shaking/shivering. Occasional purposeful movements (pushed warming blanket off several times). Moves all  extremities. Does not follow commands.  Eyes conjugate. Pupils symmetric and reactive. Muscle tone seems normal.   Skin: Skin is warm and dry.  Nursing note and vitals reviewed.   ED Course  Procedures (including critical care time)  CRITICAL CARE Performed by: Raeford Razor Total critical care time: 35  minutes Critical care time was exclusive of separately billable procedures and treating other patients. Critical care was necessary to treat or prevent imminent or life-threatening deterioration. Critical care was time spent personally by me on the following activities: development of treatment plan with patient and/or surrogate as well as nursing, discussions with consultants, evaluation of patient's response to treatment, examination of patient, obtaining history from patient or surrogate, ordering and performing treatments and interventions, ordering and review of laboratory studies, ordering and review of radiographic studies, pulse oximetry and re-evaluation of patient's condition.  Labs Review Labs Reviewed  COMPREHENSIVE METABOLIC PANEL - Abnormal; Notable for the following:    Sodium 148 (*)    Chloride 112 (*)    CO2 19 (*)    Glucose, Bld 108 (*)    BUN 95 (*)    Creatinine, Ser 3.18 (*)    Calcium 8.3 (*)    Albumin 3.2 (*)    AST 114 (*)    ALT 119 (*)    Alkaline Phosphatase 132 (*)    GFR calc non Af Amer 16 (*)    GFR calc Af Amer 18 (*)    Anion gap 17 (*)    All other components within normal limits  CBC WITH DIFFERENTIAL/PLATELET - Abnormal; Notable for the following:    Hemoglobin 10.6 (*)    HCT 33.0 (*)    MCV 72.2 (*)    MCH 23.2 (*)    RDW 17.3 (*)    Lymphs Abs 0.6 (*)    All other components within normal limits  URINALYSIS, ROUTINE W REFLEX MICROSCOPIC (NOT AT [facility]) - Abnormal; Notable for the following:    APPearance CLOUDY (*)    Bilirubin Urine SMALL (*)    Ketones, ur 15 (*)    All other components within normal limits  CULTURE, BLOOD  (ROUTINE X 2)  CULTURE, BLOOD (ROUTINE X 2)  URINE CULTURE  TROPONIN I  I-STAT CG4 LACTIC ACID, ED  CBG MONITORING, ED  I-STAT CG4 LACTIC ACID, ED    Imaging Review Dg Chest Port 1 View  03/18/2015  CLINICAL DATA:  Hypotensive today and altered level of consciousness. Recent urinary tract infection. EXAM: PORTABLE CHEST 1 VIEW COMPARISON:  Chest x-rays dated 02/06/2015 in 02/2020 2016. FINDINGS: Study is somewhat hypoinspiratory with crowding of the perihilar bronchovascular markings. Lungs are clear given the slightly low lung volumes. No evidence of pneumonia. No pleural effusion seen. Cardiomediastinal silhouette is stable in size and configuration. Osseous and soft tissue structures about the chest are unremarkable. IMPRESSION: Hypoinspiratory exam. No acute findings. No evidence of pneumonia or pleural effusion. Electronically Signed   By: Bary Richard M.D.   On: 02/25/2015 10:06   I have personally  reviewed and evaluated these images and lab results as part of my medical decision-making.   EKG Interpretation   Date/Time:  Monday February 24 2015 09:24:19 EST Ventricular Rate:  55 PR Interval:  148 QRS Duration: 162 QT Interval:  500 QTC Calculation: 478 R Axis:   12 Text Interpretation:  Sinus rhythm Probable left ventricular hypertrophy  resolution of previously noted precordial TWI Confirmed by Joyce Heitman  MD,  Maryruth Apple ([id number]) on 03/16/2015 10:25:26 AM      MDM   Final diagnoses:  Encephalopathy acute  Hypotension, unspecified hypotension type  AKI (acute kidney injury) (HCC)   Kirsten Young with altered mental status. Change from reported baseline although timing on onset unclear. No reported history of trauma or ingestion. Arrived to ED hypothermic and hypotensive. She cannot currently provide ROS.  "Code sepsis" initiated. Blood sugar remains ok. Rewarming with warming blanket and warmed IVF. Bradycardic. No rate controlling meds listed. Suspect 2/2 hypothermia. Empiric abx.   11:26  AM Persistent hypotensive at this point but only had ~1.5L NS thus far. Needs additional IVF. Lactic acid is normal, but AKI. Cr 3.18 from 1.16 two weeks ago. BUN in 90s. Will place temp foley to both better monitor temp and also I/O. Pressors if BP doesn't respond to additional volume. Mild elevation in LFTs. Could be from hypotension. Abdominal exam seems benign.  HR remains in 50s despite warming efforts. Consider cardiogenic etiology. EKG w/o overt ischemic changes. Will check troponin.   1:05 PM Remains hypotensive after over 3L.Will start levophed. Will discuss with CCM.    Raeford Razor, MD 03/18/2015 [phone]

## 2015-02-24 NOTE — H&P (Signed)
PULMONARY / CRITICAL CARE MEDICINE   Name: Kirsten Young MRN: [id number] DOB: 01/28/1960    ADMISSION DATE:  March 10, 2015 CONSULTATION DATE:  03/10/2015  REFERRING MD:  Dr. Juleen China  CHIEF COMPLAINT:  Hypotension, AMS   HISTORY OF PRESENT ILLNESS:  Presenting from skilled nursing facility with a past medical history of hypertension, diabetes, schizophrenia (newly diagnosed 01/2015 at [facility]), & hypothyroidism who presented to the [facility] ER on 2/6 via EMS with reports of altered mental status and hypotension at [facility].  Patient was recently admitted at [facility] from 1/8 - 1/13 for CAP and was discharged to [facility]. At that time she was evaluated by psychiatry due to AMS and agitation and was diagnosed with late onset schizophrenia and started on Abilify and Risperdal. She was subsequently seen in the ED 1/17 with increasing agitation, combativeness and reports of striking staff at the SNF, psych was re-consulted at that time with no changes in medications.   On 2/6 EMS was called to [facility] due to staff reporting patient was having a seizure, "moving her arms and legs, trying to grab things out of the air". When EMS arrived patient was lying on mattress on the floor and had "seemingly random jerking movement of extremities" unable to form any words. CBG at that time was 69, up to 86 after administration of IM glucagon. Apparently she was recently diagnosed with UTI but has been refusing her antibiotics. Likewise refusing other meds  Upon arrival to the ED patient was found to be bradycardic, hypotensive in the 80s and hypothermic with rectal temp 88.6. Code sepsis was activated at that time and rewarming protocol initiated with warming blanket and warmed IVF. Initial labs revealed Na 148, Creatinine 3.18 (baseline 0.9), BUN 95, AST 114, ALT 119, hemoglobin 10.6 and lactate 1.57 with initial troponin negative. CXR with no evidence of pneumonia or pleural effusion. She remained hypotensive despite  3L IVF and PCCM was consulted for admission.   Daughters report at baseline, she was living at home alone prior to January admit and was independent of ADLs.  They note that over the years she has had periods of "hallucinations" (seeing family members that are deceased and talking to people in the room that aren't there).  However, she was able to recognize her grandchildren and children on sight with normal speech.  Most recently the patient was ambulatory with a walker.   PAST MEDICAL HISTORY :  She  has a past medical history of Renal disorder; Hypertension; Diabetes mellitus without complication (HCC); Thyroid disease; Schizophrenia (HCC); and Family history of adverse reaction to anesthesia.  PAST SURGICAL HISTORY: She  has past surgical history that includes Foot surgery.  No Known Allergies  No current facility-administered medications on file prior to encounter.   Current Outpatient Prescriptions on File Prior to Encounter  Medication Sig  . ARIPiprazole (ABILIFY) 2 MG tablet Take 1 tablet (2 mg total) by mouth daily.  . furosemide (LASIX) 40 MG tablet Take 1 tablet (40 mg total) by mouth daily.  Marland Kitchen glimepiride (AMARYL) 4 MG tablet Take 1 tablet (4 mg total) by mouth daily with breakfast.  . levothyroxine (SYNTHROID, LEVOTHROID) 125 MCG tablet Take 1 tablet (125 mcg total) by mouth daily before breakfast.  . lisinopril (PRINIVIL,ZESTRIL) 5 MG tablet Take 1 tablet (5 mg total) by mouth daily.  Marland Kitchen LORazepam (ATIVAN) 0.5 MG tablet Take 1 tablet (0.5 mg total) by mouth every 12 (twelve) hours as needed (severe agitation).  . metFORMIN (GLUCOPHAGE) 500  MG tablet Take by mouth 2 (two) times daily with a meal.  . potassium chloride SA (K-DUR,KLOR-CON) 20 MEQ tablet Take 1 tablet (20 mEq total) by mouth daily.    FAMILY HISTORY:  Her indicated that her mother is alive. She indicated that her father is deceased.   SOCIAL HISTORY: She  reports that she has never smoked. She does not have  any smokeless tobacco history on file. She reports that she does not drink alcohol or use illicit drugs.  REVIEW OF SYSTEMS:   Unable to complete with patient as she is altered.    SUBJECTIVE:  RN reports pt hypotensive and altered.    VITAL SIGNS: BP 124/51 mmHg  Pulse 78  Temp(Src) 92.5 F (33.6 C) (Temporal)  Resp 18  Ht  (1.803 m)  Wt 241 lb (109.317 kg)  BMI 33.63 kg/m2  SpO2 98%  HEMODYNAMICS:    VENTILATOR SETTINGS:    INTAKE / OUTPUT:    PHYSICAL EXAMINATION: General: Obese AA female, lying in bed eyes closed, NAD  Neuro: Varies between somnolent arousing to voice and screaming/swatting with arms. Non-focal.  HEENT: Dry mucous membranes. Pupils 3 mm and sluggish, temporal wasting  Cardiovascular: RRR, no murmurs  Lungs: Diminished bilaterally, no wheezes.  Abdomen: Obese, soft, BS present  Musculoskeletal: No gross deformities.  Skin: Darkening of lower extremities, firm to touch ? Chronic venous stasis, edema   LABS:  BMET  Recent Labs Lab 03/16/2015 1000  NA 148*  K 5.1  CL 112*  CO2 19*  BUN 95*  CREATININE 3.18*  GLUCOSE 108*    Electrolytes  Recent Labs Lab 03/08/2015 1000  CALCIUM 8.3*    CBC  Recent Labs Lab 03/10/2015 1000  WBC 5.3  HGB 10.6*  HCT 33.0*  PLT 178    Coag's No results for input(s): APTT, INR in the last 168 hours.  Sepsis Markers  Recent Labs Lab 03/10/2015 1024 02/26/2015 1510 03/12/2015 1522  LATICACIDVEN 1.57 0.8 0.70    ABG No results for input(s): PHART, PCO2ART, PO2ART in the last 168 hours.  Liver Enzymes  Recent Labs Lab 03/02/2015 1000  AST 114*  ALT 119*  ALKPHOS 132*  BILITOT 0.8  ALBUMIN 3.2*    Cardiac Enzymes  Recent Labs Lab 03/14/2015 1214  TROPONINI <0.03    Glucose  Recent Labs Lab 02/19/2015 0948  GLUCAP 97    Imaging Ct Head Wo Contrast  02/23/2015  CLINICAL DATA:  55 year old with altered mental status. History of hypertension, diabetes and  schizophrenia. EXAM: CT HEAD WITHOUT CONTRAST TECHNIQUE: Contiguous axial images were obtained from the base of the skull through the vertex without intravenous contrast. COMPARISON:  Head CT 12/18/2013 and 01/26/2015. FINDINGS: There is no evidence of acute intracranial hemorrhage, mass lesion, brain edema or extra-axial fluid collection. The ventricles and subarachnoid spaces are appropriately sized for age. There is no CT evidence of acute cortical infarction. Chronic mucosal thickening is again noted within the maxillary and ethmoid sinuses bilaterally. There are no definite air-fluid levels. The mastoid air cells and middle ears are clear. 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 The calvarium is intact. IMPRESSION: Stable head CT.  No acute intracranial findings. Electronically Signed   By: Carey Bullocks M.D.   On: 02/21/2015 13:46   Dg Chest Port 1 View  02/28/2015  CLINICAL DATA:  Hypoxia EXAM: PORTABLE CHEST 1 VIEW COMPARISON:  Study obtained earlier in the day FINDINGS: Endotracheal tube tip is 2.0 cm above the carina. Central catheter tip  is in the superior vena cava. Nasogastric tube tip and side port are in the stomach. No pneumothorax. There is no edema or consolidation. The heart size and pulmonary vascularity are normal. No adenopathy. There is atherosclerotic calcification in the aortic arch. IMPRESSION: Tube and catheter positions as described without pneumothorax. No edema or consolidation. Electronically Signed   By: Bretta Bang III M.D.   On: 02/25/2015 15:56   Dg Chest Port 1 View  02/19/2015  CLINICAL DATA:  Hypotensive today and altered level of consciousness. Recent urinary tract infection. EXAM: PORTABLE CHEST 1 VIEW COMPARISON:  Chest x-rays dated 02/06/2015 in 02/2020 2016. FINDINGS: Study is somewhat hypoinspiratory with crowding of the perihilar bronchovascular markings. Lungs are clear given the slightly low lung volumes. No evidence of pneumonia. No pleural effusion  seen. Cardiomediastinal silhouette is stable in size and configuration. Osseous and soft tissue structures about the chest are unremarkable. IMPRESSION: Hypoinspiratory exam. No acute findings. No evidence of pneumonia or pleural effusion. Electronically Signed   By: Bary Richard M.D.   On: 03/10/2015 10:06    STUDIES:  CT head 2/6 >> Stable with no acute intracranial findings.  EEG 2/6 >>  RPR 2/6 >>  HIV 2/6 >>  CULTURES: Blood 2/6 >> Urine 2/6 >> Tracheal aspirate 2/6 >>   ANTIBIOTICS: Vanc 2/6 (empiric) >> Zosyn 2/6 (empiric) >>  SIGNIFICANT EVENTS: 2/6 >> Admit for AMS, hypotension, hypothermia   LINES/TUBES: RIJ CVL 2/6 >>  ETT 2/6 >>  DISCUSSION: 55 y/o female with PMH of HTN, DM, hypothyroidism and schizophrenia who presented to the ED with AMS, hypotension and hypothermia. Intubated for airway protection. Potential etiologies include hypothyroidism vs sepsis vs metabolic encephalopathy due to meds.    ASSESSMENT / PLAN:  PULMONARY A: Intubated for airway protection in the setting of AMS ? Sinusitis - noted increased thick white/yellow secretions on intubation P:  Mechanical ventilation, 8 cc/kg Wean FiO2 and PEEP for sats > 94% Intermittent CXR ABG one hour post intubation  PRN albuterol    CARDIOVASCULAR A:  Hypotension - ? Volume depletion + ACE/lasix vs sepsis Bradycardia - unclear etiology, ? Hypothyroidism vs due to hypothermia  Hx of HTN  P:  Levophed to MAP > 65  Telemetry monitoring Continue rewarming to normothermia  Hold home antihypertensives for now (Lisinopril, Lasix)    RENAL A:  AKI - creatinine 3.18 (baseline 0.9), dehydration vs ATN w/ Lisinopril and Metformin  Hypernatremia - likely secondary to dehydration  P:  Trend BMP/UOP  Urine culture pending  MIVF @ 75 ml/hr Renally dose meds as needed  Hold Lisinopril  Replete electrolytes as needed  Assess FeNa  GASTROINTESTINAL A:  SUP Morbid Obesity  ?  Malnutrition - noted temporal wasting on exam  P:  Protonix IV  NPO  OGT  Begin TF   HEMATOLOGIC A:  Microcytic Anemia - hgb 10.6 down from 13.1 on 1/19, ? Iron deficiency  DVT prophylaxis  P:  Obtain anemia panel  Trend CBC  SQ Heparin and SCDs  INFECTIOUS A:  ? Sepsis in setting of hypothermia, hypotension  P:  Follow cultures  Trend lactic acid, PCT  Trend CBC   ENDOCRINE A:  Hypothyroidism - on levothyroxine  Hypoglycemia - prior to admit, rx'd with glucagon  DM  P:  Hold Metformin and glimepiride  Assess TSH, T3, T4 Assess cortisol  Monitor CBG  SSI, moderate scale for now.   May need resistant with addition of TF coverage.     NEUROLOGIC A:  Acute encephalopathy - CT head negative   Hx of schizophrenia  P:  RASS Goal: 0 Obtain EEG  Hold home psych meds for now  Fentanyl gtt for pain PRN versed for sedation    FAMILY  - Updates: Spoke with daughter Kirsten Young on telephone 2/6.  Consented for lines / ETT via phone.    - Inter-disciplinary family meet or Palliative Care meeting due by: 03/03/15     Canary Brim, NP-C [facility] Pgr: [phone] or if no answer [phone] 03/04/2015, 4:05 PM   Attending Note:  I have examined patient, reviewed labs, studies and notes. I have discussed the case with B Ollis, and I agree with the data and plans as amended above. 55 yo woman, complicated recent medical and psych history with hospitalization and rehab placement as described above. She had been refusing meds and had presumed poor PO intake lat several days. Finally was brought to ED 2/6 hypothermic, altered, hypotensive. She had progressive altered MS with dyskinetic jerks through the day 2/6. On my evaluation she is obtunded, occasionally has a myoclonic inspiratory sigh, moves her B UE's spontaneously but unclear that there is purpose. She will not track, follow commands. After 3L IVF she has borderline BP.  Suspect multifactorial altered MS with contributions from dehydration, medications (both those she received and some she has refused like synthroid), possible sepsis although no clear source at this time. We will plan to intubate for airway protection, obtain EEG and follow metabolic status. ? Whether she will need other w/u like LP. Hold psych meds at this time especially given her abnormal movements. Will likely need neuro eval.  Independent critical care time is 45 minutes.   Levy Pupa, MD, PhD 03/16/2015, 4:34 PM [facility] [phone] or if no answer [phone]

## 2015-02-24 NOTE — ED Notes (Signed)
Dr Juleen China to call critical care

## 2015-02-24 NOTE — Progress Notes (Signed)
Pharmacy Code Sepsis Protocol  Time of code sepsis page: [phone]  Antibiotics delivered at 0947  Antibiotics administered prior to code at  (if checked, omit next 2 questions)  Were antibiotics ordered at the time of the code sepsis page? Yes Was it required to contact the physician?  Physician not contacted  Physician contacted to order antibiotics for code sepsis  Physician contacted to recommend changing antibiotics  Pharmacy consulted for: vancomycin and zosyn  Anti-infectives    Start     Dose/Rate Route Frequency Ordered Stop   March 10, 2015 1000  vancomycin (VANCOCIN) 2,000 mg in sodium chloride 0.9 % 500 mL IVPB     2,000 mg 250 mL/hr over 120 Minutes Intravenous  Once 03/10/15 0945     2015/03/10 0945  piperacillin-tazobactam (ZOSYN) IVPB 3.375 g     3.375 g 100 mL/hr over 30 Minutes Intravenous  Once 10-Mar-2015 0941     03-10-15 0945  vancomycin (VANCOCIN) IVPB 1000 mg/200 mL premix  Status:  Discontinued     1,000 mg 200 mL/hr over 60 Minutes Intravenous  Once 03/10/15 0941 Mar 10, 2015 0945        Nurse education provided:  Minutes left to administer antibiotics to achieve 1 hour goal  Correct order of antibiotic administration  Antibiotic Y-site compatibilities     Malike Foglio, Drake Leach, PharmD 03/10/2015, 9:49 AM

## 2015-02-25 ENCOUNTER — Other Ambulatory Visit (HOSPITAL_COMMUNITY): Payer: Self-pay

## 2015-02-25 ENCOUNTER — Inpatient Hospital Stay (HOSPITAL_COMMUNITY): Payer: Medicare Other

## 2015-02-25 DIAGNOSIS — R404 Transient alteration of awareness: Secondary | ICD-10-CM

## 2015-02-25 LAB — BASIC METABOLIC PANEL
ANION GAP: 17 — AB (ref 5–15)
Anion gap: 12 (ref 5–15)
BUN: 77 mg/dL — AB (ref 6–20)
BUN: 72 mg/dL — AB (ref 6–20)
CALCIUM: 7.3 mg/dL — AB (ref 8.9–10.3)
CHLORIDE: 120 mmol/L — AB (ref 101–111)
CO2: 15 mmol/L — ABNORMAL LOW (ref 22–32)
CO2: 18 mmol/L — ABNORMAL LOW (ref 22–32)
CREATININE: 2.84 mg/dL — AB (ref 0.44–1.00)
Calcium: 7.2 mg/dL — ABNORMAL LOW (ref 8.9–10.3)
Chloride: 114 mmol/L — ABNORMAL HIGH (ref 101–111)
Creatinine, Ser: 2.81 mg/dL — ABNORMAL HIGH (ref 0.44–1.00)
GFR calc Af Amer: 21 mL/min — ABNORMAL LOW (ref >60)
GFR calc Af Amer: 21 mL/min — ABNORMAL LOW (ref >60)
GFR, EST NON AFRICAN AMERICAN: 18 mL/min — AB (ref >60)
GFR, EST NON AFRICAN AMERICAN: 18 mL/min — AB (ref >60)
GLUCOSE: 137 mg/dL — AB (ref 65–99)
GLUCOSE: 228 mg/dL — AB (ref 65–99)
POTASSIUM: 4.8 mmol/L (ref 3.5–5.1)
Potassium: 6.2 mmol/L (ref 3.5–5.1)
SODIUM: 146 mmol/L — AB (ref 135–145)
Sodium: 150 mmol/L — ABNORMAL HIGH (ref 135–145)

## 2015-02-25 LAB — CBC
HCT: 31.7 % — ABNORMAL LOW (ref 36.0–46.0)
Hemoglobin: 10.1 g/dL — ABNORMAL LOW (ref 12.0–15.0)
MCH: 22.2 pg — ABNORMAL LOW (ref 26.0–34.0)
MCHC: 31.9 g/dL (ref 30.0–36.0)
MCV: 69.8 fL — AB (ref 78.0–100.0)
PLATELETS: 181 10*3/uL (ref 150–400)
RBC: 4.54 MIL/uL (ref 3.87–5.11)
RDW: 16.4 % — AB (ref 11.5–15.5)
WBC: 9.4 10*3/uL (ref 4.0–10.5)

## 2015-02-25 LAB — GLUCOSE, CAPILLARY
GLUCOSE-CAPILLARY: 132 mg/dL — AB (ref 65–99)
Glucose-Capillary: 87 mg/dL (ref 65–99)
Glucose-Capillary: 175 mg/dL — ABNORMAL HIGH (ref 65–99)
Glucose-Capillary: 176 mg/dL — ABNORMAL HIGH (ref 65–99)
Glucose-Capillary: 164 mg/dL — ABNORMAL HIGH (ref 65–99)

## 2015-02-25 LAB — URINE CULTURE: Culture: NO GROWTH

## 2015-02-25 LAB — PROCALCITONIN: PROCALCITONIN: 2.33 ng/mL

## 2015-02-25 LAB — T3, FREE: T3, Free: 2.6 pg/mL (ref 2.0–4.4)

## 2015-02-25 LAB — MAGNESIUM: MAGNESIUM: 2.5 mg/dL — AB (ref 1.7–2.4)

## 2015-02-25 LAB — RPR: RPR Ser Ql: NONREACTIVE

## 2015-02-25 LAB — PHOSPHORUS: Phosphorus: 3.4 mg/dL (ref 2.5–4.6)

## 2015-02-25 LAB — HIV ANTIBODY (ROUTINE TESTING W REFLEX): HIV Screen 4th Generation wRfx: NONREACTIVE

## 2015-02-25 MED ORDER — SODIUM POLYSTYRENE SULFONATE 15 GM/60ML PO SUSP
30.0000 g | Freq: Once | ORAL | Status: AC
  Administered 2015-02-25: 30 g

## 2015-02-25 MED ORDER — CHLORHEXIDINE GLUCONATE CLOTH 2 % EX PADS
6.0000 | MEDICATED_PAD | Freq: Every day | CUTANEOUS | Status: DC
  Administered 2015-02-25: 6 via TOPICAL

## 2015-02-25 MED ORDER — MORPHINE SULFATE 25 MG/ML IV SOLN
10.0000 mg/h | INTRAVENOUS | Status: DC
  Administered 2015-02-25: 4 mg/h via INTRAVENOUS
  Administered 2015-02-26: 10 mg/h via INTRAVENOUS
  Filled 2015-02-25 (×4): qty 10

## 2015-02-25 MED ORDER — SODIUM POLYSTYRENE SULFONATE 15 GM/60ML PO SUSP
30.0000 g | Freq: Once | ORAL | Status: DC
  Filled 2015-02-25: qty 120

## 2015-02-25 MED ORDER — MORPHINE BOLUS VIA INFUSION
5.0000 mg | INTRAVENOUS | Status: DC | PRN
  Administered 2015-02-25: 4 mg via INTRAVENOUS
  Filled 2015-02-25 (×2): qty 20

## 2015-02-25 MED ORDER — MUPIROCIN 2 % EX OINT
1.0000 "application " | TOPICAL_OINTMENT | Freq: Two times a day (BID) | CUTANEOUS | Status: DC
  Administered 2015-02-25 (×2): 1 via NASAL
  Filled 2015-02-25: qty 22

## 2015-02-25 NOTE — Progress Notes (Signed)
eLink Physician-Brief Progress Note Patient Name: MCKINLEIGH SCHUCHART DOB: 1960/05/05 MRN: [id number]   Date of Service  02/25/2015  HPI/Events of Note  K+ = 6.2. No mention that the specimen was hemolyzed.   eICU Interventions  Will order: 1. Kayexalate 30 gm via gastric tube now.  2. Repeat BMP at 12 Noon.      Intervention Category Major Interventions: Electrolyte abnormality - evaluation and management  Sommer,Steven Eugene 02/25/2015, 5:34 AM

## 2015-02-25 NOTE — Progress Notes (Signed)
Patient started on continuous morphine drip per comfort care order set. Family members present at bedside. Patient extubated to nasal cannula for comfort. Family with no needs at this time. Will continue to monitor patients comfort and address family's needs.

## 2015-02-25 NOTE — Progress Notes (Signed)
PULMONARY / CRITICAL CARE MEDICINE   Name: Kirsten Young MRN: [id number] DOB: 1960-02-26    ADMISSION DATE:  02/26/2015 CONSULTATION DATE:  02/23/2015  REFERRING MD:  Dr. Wilson Singer  CHIEF COMPLAINT:  Hypotension, AMS   HISTORY OF PRESENT ILLNESS:  Presenting from skilled nursing facility with a past medical history of hypertension, diabetes, schizophrenia (newly diagnosed 01/2015 at [facility]), & hypothyroidism who presented to the [facility] ER on 2/6 via EMS with reports of altered mental status and hypotension at [facility].  Patient was recently admitted at [facility] from 1/8 - 1/13 for CAP and was discharged to [facility]. At that time she was evaluated by psychiatry due to AMS and agitation and was diagnosed with late onset schizophrenia and started on Abilify and Risperdal. She was subsequently seen in the ED 1/17 with increasing agitation, combativeness and reports of striking staff at the SNF, psych was re-consulted at that time with no changes in medications.   On 2/6 EMS was called to [facility] due to staff reporting patient was having a seizure, "moving her arms and legs, trying to grab things out of the air". When EMS arrived patient was lying on mattress on the floor and had "seemingly random jerking movement of extremities" unable to form any words. CBG at that time was 69, up to 86 after administration of IM glucagon. Apparently she was recently diagnosed with UTI but has been refusing her antibiotics. Likewise refusing other meds  Upon arrival to the ED patient was found to be bradycardic, hypotensive in the 80s and hypothermic with rectal temp 88.6. Code sepsis was activated at that time and rewarming protocol initiated with warming blanket and warmed IVF. Initial labs revealed Na 148, Creatinine 3.18 (baseline 0.9), BUN 95, AST 114, ALT 119, hemoglobin 10.6 and lactate 1.57 with initial troponin negative. CXR with no evidence of pneumonia or pleural effusion. She remained hypotensive despite  3L IVF and PCCM was consulted for admission.   Daughters report at baseline, she was living at home alone prior to January admit and was independent of ADLs.  They note that over the years she has had periods of "hallucinations" (seeing family members that are deceased and talking to people in the room that aren't there).  However, she was able to recognize her grandchildren and children on sight with normal speech.  Most recently the patient was ambulatory with a walker.  PAST MEDICAL HISTORY :  She  has a past medical history of Renal disorder; Hypertension; Diabetes mellitus without complication ([place]); Thyroid disease; Schizophrenia ([place]); and Family history of adverse reaction to anesthesia.  PAST SURGICAL HISTORY: She  has past surgical history that includes Foot surgery.  No Known Allergies  No current facility-administered medications on file prior to encounter.   Current Outpatient Prescriptions on File Prior to Encounter  Medication Sig  . ARIPiprazole (ABILIFY) 2 MG tablet Take 1 tablet (2 mg total) by mouth daily.  . furosemide (LASIX) 40 MG tablet Take 1 tablet (40 mg total) by mouth daily.  Marland Kitchen glimepiride (AMARYL) 4 MG tablet Take 1 tablet (4 mg total) by mouth daily with breakfast.  . levothyroxine (SYNTHROID, LEVOTHROID) 125 MCG tablet Take 1 tablet (125 mcg total) by mouth daily before breakfast.  . lisinopril (PRINIVIL,ZESTRIL) 5 MG tablet Take 1 tablet (5 mg total) by mouth daily.  Marland Kitchen LORazepam (ATIVAN) 0.5 MG tablet Take 1 tablet (0.5 mg total) by mouth every 12 (twelve) hours as needed (severe agitation).  . metFORMIN (GLUCOPHAGE) 500 MG  tablet Take by mouth 2 (two) times daily with a meal.  . potassium chloride SA (K-DUR,KLOR-CON) 20 MEQ tablet Take 1 tablet (20 mEq total) by mouth daily.    FAMILY HISTORY:  Her indicated that her mother is alive. She indicated that her father is deceased.   SOCIAL HISTORY: She  reports that she has never smoked. She does not have any  smokeless tobacco history on file. She reports that she does not drink alcohol or use illicit drugs.  REVIEW OF SYSTEMS:   Unable to complete with patient as she is altered.    SUBJECTIVE:  RN reports pt hypotensive and altered.    VITAL SIGNS: BP 111/54 mmHg  Pulse 74  Temp(Src) 98.2 F (36.8 C) (Temporal)  Resp 16  Ht 5' 11"  (1.803 m)  Wt 241 lb 13.5 oz (109.7 kg)  BMI 33.75 kg/m2  SpO2 100%  HEMODYNAMICS:    VENTILATOR SETTINGS: Vent Mode:  [-] PRVC FiO2 (%):  [30 %-40 %] 30 % Set Rate:  [16 bmp-18 bmp] 16 bmp Vt Set:  [570 mL] 570 mL PEEP:  [5 cmH20] 5 cmH20 Plateau Pressure:  [17 cmH20-21 cmH20] 17 cmH20  INTAKE / OUTPUT: I/O last 3 completed shifts: In: 6479.4 [I.V.:6278.1; NG/GT:51.3; IV Piggyback:150] Out: 950 [Urine:950]  PHYSICAL EXAMINATION: General: No distress, sedated Neuro: Moves all 4 extremities, HEENT: Moist membranes.  Cardiovascular: RRR, no murmurs  Lungs: Diminished bilaterally, no wheezes.  Abdomen: Obese, soft, BS present  Musculoskeletal: No gross deformities.   LABS:  BMET  Recent Labs Lab 03/09/2015 1000 02/25/15 0400 02/25/15 1200  NA 148* 146* 150*  K 5.1 6.2* 4.8  CL 112* 114* 120*  CO2 19* 15* 18*  BUN 95* 77* 72*  CREATININE 3.18* 2.81* 2.84*  GLUCOSE 108* 137* 228*    Electrolytes  Recent Labs Lab 03/14/2015 1000 02/25/15 0400 02/25/15 1200  CALCIUM 8.3* 7.3* 7.2*  MG  --  2.5*  --   PHOS  --  3.4  --     CBC  Recent Labs Lab 03/16/2015 1000 02/25/15 0400  WBC 5.3 9.4  HGB 10.6* 10.1*  HCT 33.0* 31.7*  PLT 178 181    Coag's No results for input(s): APTT, INR in the last 168 hours.  Sepsis Markers  Recent Labs Lab 02/23/2015 1024 03/18/2015 1510 03/10/2015 1522 02/26/2015 1843 02/25/15 0400  LATICACIDVEN 1.57 0.8 0.70  --   --   PROCALCITON  --   --   --  0.99 2.33    ABG  Recent Labs Lab 02/19/2015 1851  PHART 7.452*  PCO2ART 22.3*  PO2ART 175.0*    Liver Enzymes  Recent  Labs Lab 02/26/2015 1000  AST 114*  ALT 119*  ALKPHOS 132*  BILITOT 0.8  ALBUMIN 3.2*    Cardiac Enzymes  Recent Labs Lab 02/23/2015 1214  TROPONINI <0.03    Glucose  Recent Labs Lab 03/15/2015 2039 02/20/2015 2329 02/25/15 0408 02/25/15 0748 02/25/15 1200 02/25/15 1527  GLUCAP 97 87 132* 175* 176* 164*    Imaging Dg Chest Port 1 View  02/25/2015  CLINICAL DATA:  Intubation.  Shortness breath. EXAM: PORTABLE CHEST 1 VIEW COMPARISON:  03/12/2015. FINDINGS: Endotracheal tube, NG tube, right IJ line in stable position. Mild left lower subsegmental atelectasis and or infiltrate. No pleural effusion or pneumothorax. Heart size stable. IMPRESSION: 1. Lines and tubes in stable position. 2. Mild left base subsegmental atelectasis and or infiltrate. Electronically Signed   By: Marcello Moores  Register   On: 02/25/2015 07:03  STUDIES:  CT head 2/6 >> Stable with no acute intracranial findings.  EEG 2/6 >>  RPR 2/6 >>  HIV 2/6 >>  CULTURES: Blood 2/6 >> Urine 2/6 >> Tracheal aspirate 2/6 >>   ANTIBIOTICS: Vanc 2/6 (empiric) >> Zosyn 2/6 (empiric) >>  SIGNIFICANT EVENTS: 2/6 >> Admit for AMS, hypotension, hypothermia   LINES/TUBES: RIJ CVL 2/6 >>  ETT 2/6 >>  DISCUSSION: 55 y/o female with PMH of HTN, DM, hypothyroidism and schizophrenia who presented to the ED with AMS, hypotension and hypothermia. Intubated for airway protection. Potential etiologies include hypothyroidism vs sepsis vs metabolic encephalopathy due to meds.    ASSESSMENT / PLAN:  PULMONARY A: Intubated for airway protection in the setting of AMS ? Sinusitis - noted increased thick white/yellow secretions on intubation P:  Mechanical ventilation, 8 cc/kg Wean FiO2 and PEEP for sats > 94% Intermittent CXR ABG one hour post intubation  PRN albuterol    CARDIOVASCULAR A:  Hypotension - ? Volume depletion + ACE/lasix vs sepsis Bradycardia - unclear etiology, ? Hypothyroidism vs due to hypothermia   Hx of HTN  P:  Levophed to MAP > 65  Telemetry monitoring Continue rewarming to normothermia  Hold home antihypertensives for now (Lisinopril, Lasix)    RENAL A:  AKI - creatinine 3.18 (baseline 0.9), dehydration vs ATN w/ Lisinopril and Metformin  Hypernatremia - likely secondary to dehydration  P:  Trend BMP/UOP  Urine culture pending  MIVF @ 75 ml/hr Renally dose meds as needed  Hold Lisinopril  Replete electrolytes as needed  Assess FeNa  GASTROINTESTINAL A:  SUP Morbid Obesity  ? Malnutrition - noted temporal wasting on exam  P:  Protonix IV  NPO  OGT  Begin TF   HEMATOLOGIC A:  Microcytic Anemia - hgb 10.6 down from 13.1 on 1/19, ? Iron deficiency  DVT prophylaxis  P:  Obtain anemia panel  Trend CBC  SQ Heparin and SCDs  INFECTIOUS A:  ? Sepsis in setting of hypothermia, hypotension  P:  Follow cultures  Trend lactic acid, PCT  Trend CBC   ENDOCRINE A:  Hypothyroidism - on levothyroxine  Hypoglycemia - prior to admit, rx'd with glucagon  DM  P:  Hold Metformin and glimepiride  Assess TSH, T3, T4 Assess cortisol  Monitor CBG  SSI, moderate scale for now.   May need resistant with addition of TF coverage.     NEUROLOGIC A:  Acute encephalopathy - CT head negative   Hx of schizophrenia  P:  RASS Goal: 0 Obtain EEG  Hold home psych meds for now  Fentanyl gtt for pain PRN versed for sedation   FAMILY  - Updates:  I had and extensive discussion with her 2 daughters Kirsten Young and Kirsten Young, the [place]. They informed me that Mrs. Baik would not want to be supported by the Vent in any way even for temporary period of time. I informed them that if this is due to sepsis, metabolic abnormalities, seizures there is potentially a chance to improve her mental status. However they were very clear that she would not want to be on the vent and are requesting a terminal wean. I met later with the extended family  and they all seem to be in agreement that this is what she would have wished.   MY CC time- 45 mins.   Marshell Garfinkel MD [facility] Pager [phone] If no answer or after 3pm call: [phone] 02/25/2015, 4:17 PM

## 2015-02-25 NOTE — Progress Notes (Signed)
CHaplain met with family - support not desired at this time.

## 2015-02-25 NOTE — Progress Notes (Signed)
Initial Nutrition Assessment  DOCUMENTATION CODES:   Obesity unspecified  INTERVENTION:    If plans to continue supportive care, recommend continue TF via OGT with Vital High Protein, increase to goal rate of 55 ml/h (1320 ml per day) to provide 1520 kcals, 146 gm protein, 1104 ml free water daily.  NUTRITION DIAGNOSIS:   Inadequate oral intake related to inability to eat as evidenced by NPO status.  GOAL:   Provide needs based on ASPEN/SCCM guidelines  MONITOR:   Vent status, Labs, Weight trends, TF tolerance, I & O's  REASON FOR ASSESSMENT:   Consult Enteral/tube feeding initiation and management  ASSESSMENT:   55 year old female presenting from skilled nursing facility with a past medical history of hypertension, diabetes, schizophrenia (newly diagnosed 01/2015 at [facility]), & hypothyroidism who presented to the [facility] ER on 2/6 via EMS with reports of altered mental status and hypotension at SNF.  Labs reviewed: sodium, potassium, magnesium elevated. Received MD Consult for TF initiation and management. TF was started this morning. Per discussion with nursing staff and physician, may transition to comfort care today; family is awaiting pastor to visit. Did not complete nutrition focused physical exam.   Patient is currently intubated on ventilator support Temp (24hrs), Avg:94.5 F (34.7 C), Min:90.3 F (32.4 C), Max:99.5 F (37.5 C)   Diet Order:  Diet NPO time specified  Skin:  Reviewed, no issues  Last BM:  unknown  Height:   Ht Readings from Last 1 Encounters:  02/25/2015  (1.803 m)    Weight:   Wt Readings from Last 1 Encounters:  02/25/15 241 lb 13.5 oz (109.7 kg)    Ideal Body Weight:  70.5 kg  BMI:  Body mass index is 33.75 kg/(m^2).  Estimated Nutritional Needs:   Kcal:  1610-9604  Protein:  140-155 gm  Fluid:  2 L  EDUCATION NEEDS:   No education needs identified at this time  Joaquin Courts, RD, LDN, CNSC Pager  [phone] After Hours Pager [phone]

## 2015-02-25 NOTE — Progress Notes (Signed)
Sputum sample obtained and sent to lab by RT. 

## 2015-02-25 NOTE — Care Management Note (Signed)
Case Management Note  Patient Details  Name: Kirsten Young MRN: [id number] Date of Birth: 10-Dec-1960  Subjective/Objective:  Pt admitted with hypotension.                  Action/Plan:  Pt is from [facility].  Plans are for terminal wean.  CSW consulted, CM will continue to follow   Expected Discharge Date:                  Expected Discharge Plan:  Skilled Nursing Facility  In-House Referral:  Clinical Social Work  Discharge planning Services  CM Consult  Post Acute Care Choice:    Choice offered to:     DME Arranged:    DME Agency:     HH Arranged:    HH Agency:     Status of Service:  In process, will continue to follow  Medicare Important Message Given:    Date Medicare IM Given:    Medicare IM give by:    Date Additional Medicare IM Given:    Additional Medicare Important Message give by:     If discussed at Long Length of Stay Meetings, dates discussed:    Additional Comments:  Cherylann Parr, RN 02/25/2015, 2:14 PM

## 2015-02-25 NOTE — Procedures (Signed)
Extubation Procedure Note  Patient Details:   Name: Kirsten Young DOB: 1960/04/19 MRN: [id number]   Airway Documentation:     Evaluation  O2 sats: stable throughout Complications: No apparent complications Patient did tolerate procedure well. Bilateral Breath Sounds: Clear, Diminished Suctioning: Airway Yes  Patient extubated to a 2 Lpm nasal cannula per the withdrawal of life sustaining protocol. Positive for cuff leak. Patient resting comfortably with no dyspnea or stridor. Family at bedside.  Ancil Boozer 02/25/2015, 5:20 PM

## 2015-02-26 DIAGNOSIS — I959 Hypotension, unspecified: Secondary | ICD-10-CM

## 2015-02-26 DIAGNOSIS — Z66 Do not resuscitate: Secondary | ICD-10-CM

## 2015-02-26 DIAGNOSIS — Z515 Encounter for palliative care: Secondary | ICD-10-CM

## 2015-02-26 DIAGNOSIS — R40244 Other coma, without documented Glasgow coma scale score, or with partial score reported, unspecified time: Secondary | ICD-10-CM

## 2015-02-26 NOTE — Care Management Note (Signed)
Case Management Note  Patient Details  Name: Kirsten Young MRN: [id number] Date of Birth: September 24, 1960  Subjective/Objective:    CM following for progression and d/c planning.                Action/Plan: 02/26/2015 Pt received from ICU s/p terminal wean, for comfort care. Awaiting Palliative consult.   Expected Discharge Date:                  Expected Discharge Plan:  Skilled Nursing Facility  In-House Referral:  Clinical Social Work  Discharge planning Services  CM Consult  Post Acute Care Choice:    Choice offered to:     DME Arranged:    DME Agency:     HH Arranged:    HH Agency:     Status of Service:  In process, will continue to follow  Medicare Important Message Given:    Date Medicare IM Given:    Medicare IM give by:    Date Additional Medicare IM Given:    Additional Medicare Important Message give by:     If discussed at Long Length of Stay Meetings, dates discussed:    Additional Comments:  Starlyn Skeans, RN 02/26/2015, 10:43 AM

## 2015-02-26 NOTE — Progress Notes (Addendum)
PULMONARY / CRITICAL CARE MEDICINE   Name: Kirsten Young MRN: [id number] DOB: 1960/04/19    ADMISSION DATE:  02/20/2015 CONSULTATION DATE:  03/09/2015  REFERRING MD:  Dr. Wilson Singer  CHIEF COMPLAINT:  Hypotension, AMS   BRIEF  Presenting from skilled nursing facility with a past medical history of hypertension, diabetes, schizophrenia (newly diagnosed 01/2015 at [facility]), & hypothyroidism who presented to the [facility] ER on 2/6 via EMS with reports of altered mental status and hypotension at SNF.  Patient was recently admitted at [facility] from 1/8 - 1/13 for CAP and was discharged to [facility]. At that time she was evaluated by psychiatry due to AMS and agitation and was diagnosed with late onset schizophrenia and started on Abilify and Risperdal. She was subsequently seen in the ED 1/17 with increasing agitation, combativeness and reports of striking staff at the SNF, psych was re-consulted at that time with no changes in medications.   On 2/6 EMS was called to [facility] due to staff reporting patient was having a seizure, "moving her arms and legs, trying to grab things out of the air". When EMS arrived patient was lying on mattress on the floor and had "seemingly random jerking movement of extremities" unable to form any words. CBG at that time was 69, up to 86 after administration of IM glucagon. Apparently she was recently diagnosed with UTI but has been refusing her antibiotics. Likewise refusing other meds  Upon arrival to the ED patient was found to be bradycardic, hypotensive in the 80s and hypothermic with rectal temp 88.6. Code sepsis was activated at that time and rewarming protocol initiated with warming blanket and warmed IVF. Initial labs revealed Na 148, Creatinine 3.18 (baseline 0.9), BUN 95, AST 114, ALT 119, hemoglobin 10.6 and lactate 1.57 with initial troponin negative. CXR with no evidence of pneumonia or pleural effusion. She remained hypotensive despite 3L IVF and PCCM was  consulted for admission.   Daughters report at baseline, she was living at home alone prior to January admit and was independent of ADLs.  They note that over the years she has had periods of "hallucinations" (seeing family members that are deceased and talking to people in the room that aren't there).  However, she was able to recognize her grandchildren and children on sight with normal speech.  Most recently the patient was ambulatory with a walker.  LINES/TUBES: RIJ CVL 2/6 >>  ETT 2/6 >>2/7   STUDIES:  CT head 2/6 >> Stable with no acute intracranial findings.  EEG 2/6 >>  RPR 2/6 >>  HIV 2/6 >> NEG  CULTURES: Results for orders placed or performed during the hospital encounter of 03/15/2015  Blood Culture (routine x 2)     Status: None (Preliminary result)   Collection Time: 02/19/2015  9:55 AM  Result Value Ref Range Status   Specimen Description BLOOD RIGHT HAND  Final   Special Requests BOTTLES DRAWN AEROBIC AND ANAEROBIC 5CC  Final   Culture NO GROWTH 1 DAY  Final   Report Status PENDING  Incomplete  Urine culture     Status: None   Collection Time: 03/08/2015  9:56 AM  Result Value Ref Range Status   Specimen Description URINE, CATHETERIZED  Final   Special Requests NONE  Final   Culture NO GROWTH 1 DAY  Final   Report Status 02/25/2015 FINAL  Final  Blood Culture (routine x 2)     Status: None (Preliminary result)   Collection Time: 03/18/2015 10:20 AM  Result Value Ref Range Status   Specimen Description BLOOD LEFT HAND  Final   Special Requests BOTTLES DRAWN AEROBIC AND ANAEROBIC 5CC  Final   Culture NO GROWTH 1 DAY  Final   Report Status PENDING  Incomplete  MRSA PCR Screening     Status: Abnormal   Collection Time: 03/10/2015  5:55 PM  Result Value Ref Range Status   MRSA by PCR POSITIVE (A) NEGATIVE Final    Comment:        The GeneXpert MRSA Assay (FDA approved for NASAL specimens only), is one component of a comprehensive MRSA colonization surveillance  program. It is not intended to diagnose MRSA infection nor to guide or monitor treatment for MRSA infections. RESULT CALLED TO, READ BACK BY AND VERIFIED WITH: C.WAFFORD,RN AT 2201 02/20/2015 BY L.PITT   Culture, respiratory (NON-Expectorated)     Status: None (Preliminary result)   Collection Time: 02/25/15 12:30 PM  Result Value Ref Range Status   Specimen Description TRACHEAL ASPIRATE  Final   Special Requests NONE  Final   Gram Stain   Final    ABUNDANT WBC PRESENT, PREDOMINANTLY PMN NO SQUAMOUS EPITHELIAL CELLS SEEN FEW GRAM NEGATIVE RODS Performed at [facility]    Culture   Final    ABUNDANT GRAM NEGATIVE RODS Performed at [facility]    Report Status PENDING  Incomplete     ANTIBIOTICS: Anti-infectives    Start     Dose/Rate Route Frequency Ordered Stop   02/26/15 1200  vancomycin (VANCOCIN) 1,500 mg in sodium chloride 0.9 % 500 mL IVPB  Status:  Discontinued     1,500 mg 250 mL/hr over 120 Minutes Intravenous Every 48 hours 03/17/2015 1051 02/25/15 1737   02/19/2015 2051  piperacillin-tazobactam (ZOSYN) IVPB 3.375 g  Status:  Discontinued     3.375 g 12.5 mL/hr over 240 Minutes Intravenous Every 8 hours 03/12/2015 2052 02/25/15 1737   02/19/2015 1700  piperacillin-tazobactam (ZOSYN) IVPB 3.375 g  Status:  Discontinued     3.375 g 12.5 mL/hr over 240 Minutes Intravenous Every 8 hours 02/20/2015 1051 03/05/2015 2052   03/17/2015 1000  vancomycin (VANCOCIN) 2,000 mg in sodium chloride 0.9 % 500 mL IVPB     2,000 mg 250 mL/hr over 120 Minutes Intravenous  Once 02/26/2015 0945 03/03/2015 1323   03/13/2015 0945  piperacillin-tazobactam (ZOSYN) IVPB 3.375 g     3.375 g 100 mL/hr over 30 Minutes Intravenous  Once 03/11/2015 0941 03/07/2015 1051   03/16/2015 0945  vancomycin (VANCOCIN) IVPB 1000 mg/200 mL premix  Status:  Discontinued     1,000 mg 200 mL/hr over 60 Minutes Intravenous  Once 03/11/2015 0941 03/16/2015 0945       SIGNIFICANT EVENTS: 2/6 >> Admit for AMS,  hypotension, hypothermia  02/25/15:   RN reports pt hypotensive and altered.  PCCM MD family discussion Dr Vaughan Browner -> "Updates:  I had and extensive discussion with her 2 daughters Knox and Nonnie, the [place]. They informed me that Mrs. Kilburg would not want to be supported by the Vent in any way even for temporary period of time. I informed them that if this is due to sepsis, metabolic abnormalities, seizures there is potentially a chance to improve her mental status. However they were very clear that she would not want to be on the vent and are requesting a terminal wean. I met later with the extended family and they all seem to be in agreement that this is what she would have wished. "  SUBJECTIVE/OVERNIGHT/INTERVAL HX 02/26/15 - Now in [place] palliative floor. Terminal care status. Unrespnsove on morphine gtt per RN. Poor Ur OP in foley per RN. No discomfort events overnight per RN. Death rattle  Heard at bedside  VITAL SIGNS: BP 81/44 mmHg  Pulse 75  Temp(Src) 97.2 F (36.2 C) (Temporal)  Resp 8  Ht 5' 11"  (1.803 m)  Wt 109.7 kg (241 lb 13.5 oz)  BMI 33.75 kg/m2  SpO2 100%  HEMODYNAMICS:    VENTILATOR SETTINGS: Vent Mode:  [-] PRVC FiO2 (%):  [30 %] 30 % Set Rate:  [16 bmp] 16 bmp Vt Set:  [570 mL] 570 mL PEEP:  [5 cmH20] 5 cmH20 Plateau Pressure:  [17 cmH20] 17 cmH20  INTAKE / OUTPUT: I/O last 3 completed shifts: In: 3336.3 [I.V.:2709.3; NG/GT:377; IV Piggyback:250] Out: 3557 [Urine:1475]  PHYSICAL EXAMINATION: General: Looks like actively dying Neuro:RASS -4 equivalent on morphine gtt HEENT: DEATH RATTLE + Cardiovascular: RRR, no murmurs  Lungs: Diminished bilaterally, no wheezes.  Abdomen: Obese, soft, BS present  Musculoskeletal: No gross deformities.   LABS:  BMET  Recent Labs Lab 03/05/2015 1000 02/25/15 0400 02/25/15 1200  NA 148* 146* 150*  K 5.1 6.2* 4.8  CL 112* 114* 120*  CO2 19* 15* 18*  BUN 95* 77* 72*  CREATININE 3.18* 2.81* 2.84*  GLUCOSE  108* 137* 228*    Electrolytes  Recent Labs Lab 03/04/2015 1000 02/25/15 0400 02/25/15 1200  CALCIUM 8.3* 7.3* 7.2*  MG  --  2.5*  --   PHOS  --  3.4  --     CBC  Recent Labs Lab 02/28/2015 1000 02/25/15 0400  WBC 5.3 9.4  HGB 10.6* 10.1*  HCT 33.0* 31.7*  PLT 178 181    Coag's No results for input(s): APTT, INR in the last 168 hours.  Sepsis Markers  Recent Labs Lab 03/13/2015 1024 03/06/2015 1510 03/10/2015 1522 02/26/2015 1843 02/25/15 0400  LATICACIDVEN 1.57 0.8 0.70  --   --   PROCALCITON  --   --   --  0.99 2.33    ABG  Recent Labs Lab 02/26/2015 1851  PHART 7.452*  PCO2ART 22.3*  PO2ART 175.0*    Liver Enzymes  Recent Labs Lab 02/20/2015 1000  AST 114*  ALT 119*  ALKPHOS 132*  BILITOT 0.8  ALBUMIN 3.2*    Cardiac Enzymes  Recent Labs Lab 03/05/2015 1214  TROPONINI <0.03    Glucose  Recent Labs Lab 03/16/2015 2039 02/23/2015 2329 02/25/15 0408 02/25/15 0748 02/25/15 1200 02/25/15 1527  GLUCAP 97 87 132* 175* 176* 164*    Imaging No results found.  DISCUSSION: 55 y/o female with PMH of HTN, DM, hypothyroidism and schizophrenia who presented to the ED with AMS, hypotension and hypothermia. Intubated for airway protection. Potential etiologies include hypothyroidism vs sepsis vs metabolic encephalopathy due to meds.    ASSESSMENT / PLAN:  PULMONARY A: Intubated 03/12/2015  for airway protection in the setting of AMS. Terminall;y extubated 2/17 ? Sinusitis - noted increased thick white/yellow secretions on intubation #current  - RR 8 terminall ill  P:    CARDIOVASCULAR A:  Hx of HTN   #At admit Hypotension - ? Volume depletion + ACE/lasix vs sepsis Bradycardia - unclear etiology, ? Hypothyroidism vs due to hypothermia   #current - hypotensive sbp 80s - terminally ill   RENAL A:  AKI - creatinine 3.18 (baseline 0.9), dehydration vs ATN w/ Lisinopril and Metformin  Hypernatremia - likely secondary to dehydration    #current  -  oliguric - terminal care  GASTROINTESTINAL A:  ? Malnutrition - noted temporal wasting on exam     HEMATOLOGIC A:  Microcytic Anemia - hgb 10.6 down from 13.1 on 1/19, ? Iron deficiency    INFECTIOUS A:  ? Sepsis in setting of hypothermia, hypotension . Possible GNR Pneumonia - cultures pending  ENDOCRINE A:  Hypothyroidism - on levothyroxine  Hypoglycemia - prior to admit, rx'd with glucagon  DM    NEUROLOGIC A:  Acute encephalopathy - CT head negative  at admit Hx of schizophrenia   #current  - comatose on terminal care morphine gtt   FAMILY  - none at bedside 02/26/2015  GLOBAL - actively dying. Comfortable. Based on RR 8, Death rattle, oliguria and sbp 80 - prognosis is few to several hours , < 24h. Cannot go to to [place] place. Have asked pall care about transfer to GIP status - left message. Continue morphine for comfort   Dr. Brand Males, M.D., [facility].C.P Pulmonary and Critical Care Medicine Staff Physician [place] [facility] Pager: [phone], If no answer or between  15:00h - 7:00h: call [phone]  02/26/2015 9:00 AM

## 2015-02-26 NOTE — Progress Notes (Signed)
Nutrition Brief Note  Chart reviewed. Pt is comfort care.  No further nutrition interventions warranted at this time.  Please re-consult as needed.   Monika Chestang, MS, RD, LDN Pager # [phone] After hours/ weekend pager # [phone]    

## 2015-02-26 NOTE — Progress Notes (Signed)
Transfer note:  Arrival Method bed Mental Orientation unable to determine  Telemetry:none Assessment: see doc flowsheet Skin: Dry, intact IV: left handI  / right hand  /nsl  But fluids infusing  Via right  IJ Pain:  Morphine gtt Tubes:  Foley cath inplace and secured  Right  thigh  Fall Prevention Safety Plan:  Bed alarm  Placed  6700 Orientation: Patient has  No family  Present  At this time

## 2015-02-27 DIAGNOSIS — Z515 Encounter for palliative care: Secondary | ICD-10-CM

## 2015-02-27 DIAGNOSIS — Z66 Do not resuscitate: Secondary | ICD-10-CM

## 2015-02-27 LAB — CULTURE, RESPIRATORY W GRAM STAIN

## 2015-02-27 LAB — CULTURE, RESPIRATORY

## 2015-02-28 ENCOUNTER — Telehealth: Payer: Self-pay

## 2015-02-28 NOTE — Telephone Encounter (Signed)
On 02/28/2015 I received a death certificate from [facility] (Original). The death certificate is for burial. The patient is a patient of Doctor Ramaswamy. The death certificate will be taken to Pulmonary Unit @ [place] Monday pm for signature. On 03/04/17I received the death certificate back from Doctor Ramaswamy. I got the death certificate ready and called the funeral home to let them know the death certificate is ready for pickup.

## 2015-03-01 LAB — CULTURE, BLOOD (ROUTINE X 2)
Culture: NO GROWTH
Culture: NO GROWTH

## 2015-03-19 NOTE — Progress Notes (Signed)
Palliative Care RN Note: Consult received yesterday; pt is actively dying. Checked in on pt; she is asymptomatic at this time. Family states they are comfortable with pt's status re: comfort. Palliative Care RN will follow up tomorrow to ensure that this remains the case. Donn Pierini, RN, BSN, [facility] 02/19/2015 12:51 PM

## 2015-03-19 NOTE — Progress Notes (Signed)
[facility] informed .Case #[id number].As per Kirsten Young from [facility].patient is suitable for eye donation.

## 2015-03-19 NOTE — Progress Notes (Signed)
Called into the patient's room by family members,very pale looking.Called in another nurse to assessed.Nosigns of life.Vital signs ceased as confirmed by another nurse.Time of expiration -1317.Primay M.D. paged and made aware.

## 2015-03-19 NOTE — Care Management Important Message (Signed)
Important Message  Patient Details  Name: BRITNAY MAGNUSSEN MRN: [id number] Date of Birth: 10/12/1960   Medicare Important Message Given:  Yes    Dodi Leu, Annamarie Major, RN March 06, 2015, 11:27 AM

## 2015-03-19 NOTE — Progress Notes (Signed)
PULMONARY / CRITICAL CARE MEDICINE   Name: MAYLEEN BORRERO MRN: [id number] DOB: 1960/04/25    ADMISSION DATE:  03/04/2015 CONSULTATION DATE:  03/06/2015  REFERRING MD:  Dr. Wilson Singer  CHIEF COMPLAINT:  Hypotension, AMS   LINES/TUBES: RIJ CVL 2/6 >>  ETT 2/6 >>2/7  STUDIES:  CT head 2/6 >> Stable with no acute intracranial findings.  RPR 2/6 >> NR HIV 2/6 >> NEG  CULTURES: Results for orders placed or performed during the hospital encounter of 03/15/2015  Blood Culture (routine x 2)     Status: None (Preliminary result)   Collection Time: 03/06/2015  9:55 AM  Result Value Ref Range Status   Specimen Description BLOOD RIGHT HAND  Final   Special Requests BOTTLES DRAWN AEROBIC AND ANAEROBIC 5CC  Final   Culture NO GROWTH 2 DAYS  Final   Report Status PENDING  Incomplete  Urine culture     Status: None   Collection Time: 03/03/2015  9:56 AM  Result Value Ref Range Status   Specimen Description URINE, CATHETERIZED  Final   Special Requests NONE  Final   Culture NO GROWTH 1 DAY  Final   Report Status 02/25/2015 FINAL  Final  Blood Culture (routine x 2)     Status: None (Preliminary result)   Collection Time: 03/08/2015 10:20 AM  Result Value Ref Range Status   Specimen Description BLOOD LEFT HAND  Final   Special Requests BOTTLES DRAWN AEROBIC AND ANAEROBIC 5CC  Final   Culture NO GROWTH 2 DAYS  Final   Report Status PENDING  Incomplete  MRSA PCR Screening     Status: Abnormal   Collection Time: 02/28/2015  5:55 PM  Result Value Ref Range Status   MRSA by PCR POSITIVE (A) NEGATIVE Final    Comment:        The GeneXpert MRSA Assay (FDA approved for NASAL specimens only), is one component of a comprehensive MRSA colonization surveillance program. It is not intended to diagnose MRSA infection nor to guide or monitor treatment for MRSA infections. RESULT CALLED TO, READ BACK BY AND VERIFIED WITH: C.WAFFORD,RN AT 2201 03/12/2015 BY L.PITT   Culture, respiratory (NON-Expectorated)      Status: None   Collection Time: 02/25/15 12:30 PM  Result Value Ref Range Status   Specimen Description TRACHEAL ASPIRATE  Final   Special Requests NONE  Final   Gram Stain   Final    ABUNDANT WBC PRESENT, PREDOMINANTLY PMN NO SQUAMOUS EPITHELIAL CELLS SEEN FEW GRAM NEGATIVE RODS Performed at [facility]    Culture   Final    ABUNDANT PSEUDOMONAS AERUGINOSA Performed at [facility]    Report Status March 23, 2015 FINAL  Final   Organism ID, Bacteria PSEUDOMONAS AERUGINOSA  Final      Susceptibility   Pseudomonas aeruginosa - MIC*    CEFEPIME 2 SENSITIVE Sensitive     CEFTAZIDIME 4 SENSITIVE Sensitive     CIPROFLOXACIN <=0.25 SENSITIVE Sensitive     GENTAMICIN <=1 SENSITIVE Sensitive     IMIPENEM 1 SENSITIVE Sensitive     PIP/TAZO <=4 SENSITIVE Sensitive     TOBRAMYCIN <=1 SENSITIVE Sensitive     * ABUNDANT PSEUDOMONAS AERUGINOSA    SIGNIFICANT EVENTS: 2/6 >> Admit for AMS, hypotension, hypothermia  02/25/15:   RN reports pt hypotensive and altered.  PCCM MD family discussion Dr Vaughan Browner -> "Updates:  I had and extensive discussion with her 2 daughters Winton and Kelsha, the [place]. They informed me that Mrs. Ridlon would not want  to be supported by the Vent in any way even for temporary period of time. I informed them that if this is due to sepsis, metabolic abnormalities, seizures there is potentially a chance to improve her mental status. However they were very clear that she would not want to be on the vent and are requesting a terminal wean. I met later with the extended family and they all seem to be in agreement that this is what she would have wished. " 2/8> Transfer to [place] for terminal care  SUBJECTIVE: Daughters at bedside. Report that she is unresponsive.   VITAL SIGNS: BP 71/32 mmHg  Pulse 93  Temp(Src) 97.2 F (36.2 C) (Temporal)  Resp 11  Ht _0  (1.803 m)  Wt 241 lb 13.5 oz (109.7 kg)  BMI 33.75 kg/m2  SpO2 97%  INTAKE / OUTPUT: I/O last 3  completed shifts: In: 254.8 [I.V.:254.8] Out: 175 [Urine:175]  PHYSICAL EXAMINATION: General: Looks like actively dying Neuro:RASS -4 equivalent on morphine gtt HEENT:[place]/AT Cardiovascular: RRR, no murmurs  Lungs: Diminished bilaterally, no wheezes.  Abdomen: Obese, soft, BS present  Musculoskeletal: No gross deformities.   LABS:  BMET  Recent Labs Lab 03/06/2015 1000 02/25/15 0400 02/25/15 1200  NA 148* 146* 150*  K 5.1 6.2* 4.8  CL 112* 114* 120*  CO2 19* 15* 18*  BUN 95* 77* 72*  CREATININE 3.18* 2.81* 2.84*  GLUCOSE 108* 137* 228*    Electrolytes  Recent Labs Lab 02/26/2015 1000 02/25/15 0400 02/25/15 1200  CALCIUM 8.3* 7.3* 7.2*  MG  --  2.5*  --   PHOS  --  3.4  --     CBC  Recent Labs Lab 03/09/2015 1000 02/25/15 0400  WBC 5.3 9.4  HGB 10.6* 10.1*  HCT 33.0* 31.7*  PLT 178 181   Sepsis Markers  Recent Labs Lab 03/12/2015 1024 03/08/2015 1510 03/09/2015 1522 03/11/2015 1843 02/25/15 0400  LATICACIDVEN 1.57 0.8 0.70  --   --   PROCALCITON  --   --   --  0.99 2.33    ABG  Recent Labs Lab 03/07/2015 1851  PHART 7.452*  PCO2ART 22.3*  PO2ART 175.0*    Liver Enzymes  Recent Labs Lab 03/07/2015 1000  AST 114*  ALT 119*  ALKPHOS 132*  BILITOT 0.8  ALBUMIN 3.2*    Cardiac Enzymes  Recent Labs Lab 03/08/2015 1214  TROPONINI <0.03    Glucose  Recent Labs Lab 03/10/2015 2039 02/23/2015 2329 02/25/15 0408 02/25/15 0748 02/25/15 1200 02/25/15 1527  GLUCAP 97 87 132* 175* 176* 164*   DISCUSSION: 55 y/o female with PMH of HTN, DM, hypothyroidism and schizophrenia who presented to the ED with AMS, hypotension and hypothermia. Intubated for airway protection. Potential etiologies include hypothyroidism vs sepsis vs metabolic encephalopathy due to meds.    ASSESSMENT / PLAN:  Acute encephalopathy, Pseudomonas PNA:  Terminally extubated 2/17. Actively dying. Appears comfortable. Prognosis 2-3 days. Unstable for transfer to  inpatient hospice. -Albuterol neb prn -Versed prn agitation -Morphine gtt for comfort  HTN: Currently hypotensive  AKI: Oliguric  Hypernatremia: likely secondary to dehydration   Microcytic Anemia  Hypothyroidism  Hx of schizophrenia    Jacques Earthly, MD  Internal Medicine PGY-2  [place] [facility] Pager: [phone]  2015-03-20 10:32 AM

## 2015-03-19 NOTE — Progress Notes (Signed)
Morphine intravenous drip left over at 110 cc wasted on the sink as witnessed by [employer].N.

## 2015-03-19 DEATH — deceased

## 2015-04-19 NOTE — Discharge Summary (Signed)
DISCHARGE SUMMARY    Date of admit: 03/13/2015  9:15 AM Date of discharge: 02/26/2015  4:27 PM Length of Stay: 3 days  PCP is Derwood Kaplan, MD   PROBLEM LIST Active Problems:   Altered mental state   Hypothyroidism   Acute sinusitis   Hypotension   Encephalopathy acute   Acute respiratory failure [facility])   Terminal care   DNAR (do not attempt resuscitation)    SUMMARY Kirsten Young was 55 y.o. patient with    has a past medical history of Renal disorder; Hypertension; Diabetes mellitus without complication (HCC); Thyroid disease; Schizophrenia (HCC); and Family history of adverse reaction to anesthesia.   has past surgical history that includes Foot surgery.   Admitted on 03/02/2015 with  Presenting from skilled nursing facility with a past medical history of hypertension, diabetes, schizophrenia (newly diagnosed 01/2015 at [facility]), & hypothyroidism who presented to the [facility] ER on 2/6 via EMS with reports of altered mental status and hypotension at SNF.  Patient was recently admitted at [facility] from 1/8 - 1/13 for CAP and was discharged to [facility]. At that time she was evaluated by psychiatry due to AMS and agitation and was diagnosed with late onset schizophrenia and started on Abilify and Risperdal. She was subsequently seen in the ED 1/17 with increasing agitation, combativeness and reports of striking staff at the SNF, psych was re-consulted at that time with no changes in medications.   On 2/6 EMS was called to [facility] due to staff reporting patient was having a seizure, "moving her arms and legs, trying to grab things out of the air". When EMS arrived patient was lying on mattress on the floor and had "seemingly random jerking movement of extremities" unable to form any words. CBG at that time was 69, up to 86 after administration of IM glucagon. Apparently she was recently diagnosed with UTI but has been refusing her antibiotics. Likewise refusing  other meds  Upon arrival to the ED patient was found to be bradycardic, hypotensive in the 80s and hypothermic with rectal temp 88.6. Code sepsis was activated at that time and rewarming protocol initiated with warming blanket and warmed IVF. Initial labs revealed Na 148, Creatinine 3.18 (baseline 0.9), BUN 95, AST 114, ALT 119, hemoglobin 10.6 and lactate 1.57 with initial troponin negative. CXR with no evidence of pneumonia or pleural effusion. She remained hypotensive despite 3L IVF and PCCM was consulted for admission.   Daughters report at baseline, she was living at home alone prior to January admit and was independent of ADLs. They note that over the years she has had periods of "hallucinations" (seeing family members that are deceased and talking to people in the room that aren't there). However, she was able to recognize her grandchildren and children on sight with normal speech. Most recently the patient was ambulatory with a walker.    She was iniitially intubated. She had ATN with creat 3.18 and had acute encephalopathy. CCM MD had discussion with POA and based on best interest principles and substituted judgement extubated her terminally 02/25/15. She then expired 03/15/2015     SIGNED Dr. Kalman Shan, M.D., [facility].C.P Pulmonary and Critical Care Medicine Staff Physician [facility] [place] [facility] Pager: [phone], If no answer or between  15:00h - 7:00h: call [phone]  03/19/2015 5:21 AM

## 2016-10-13 IMAGING — CR DG CHEST 1V PORT
1 series · 1 of 1 positions shown · non-contrast
Comparison: Study obtained earlier in the day

CLINICAL DATA: Hypoxia

EXAM:
PORTABLE CHEST 1 VIEW

[AP]
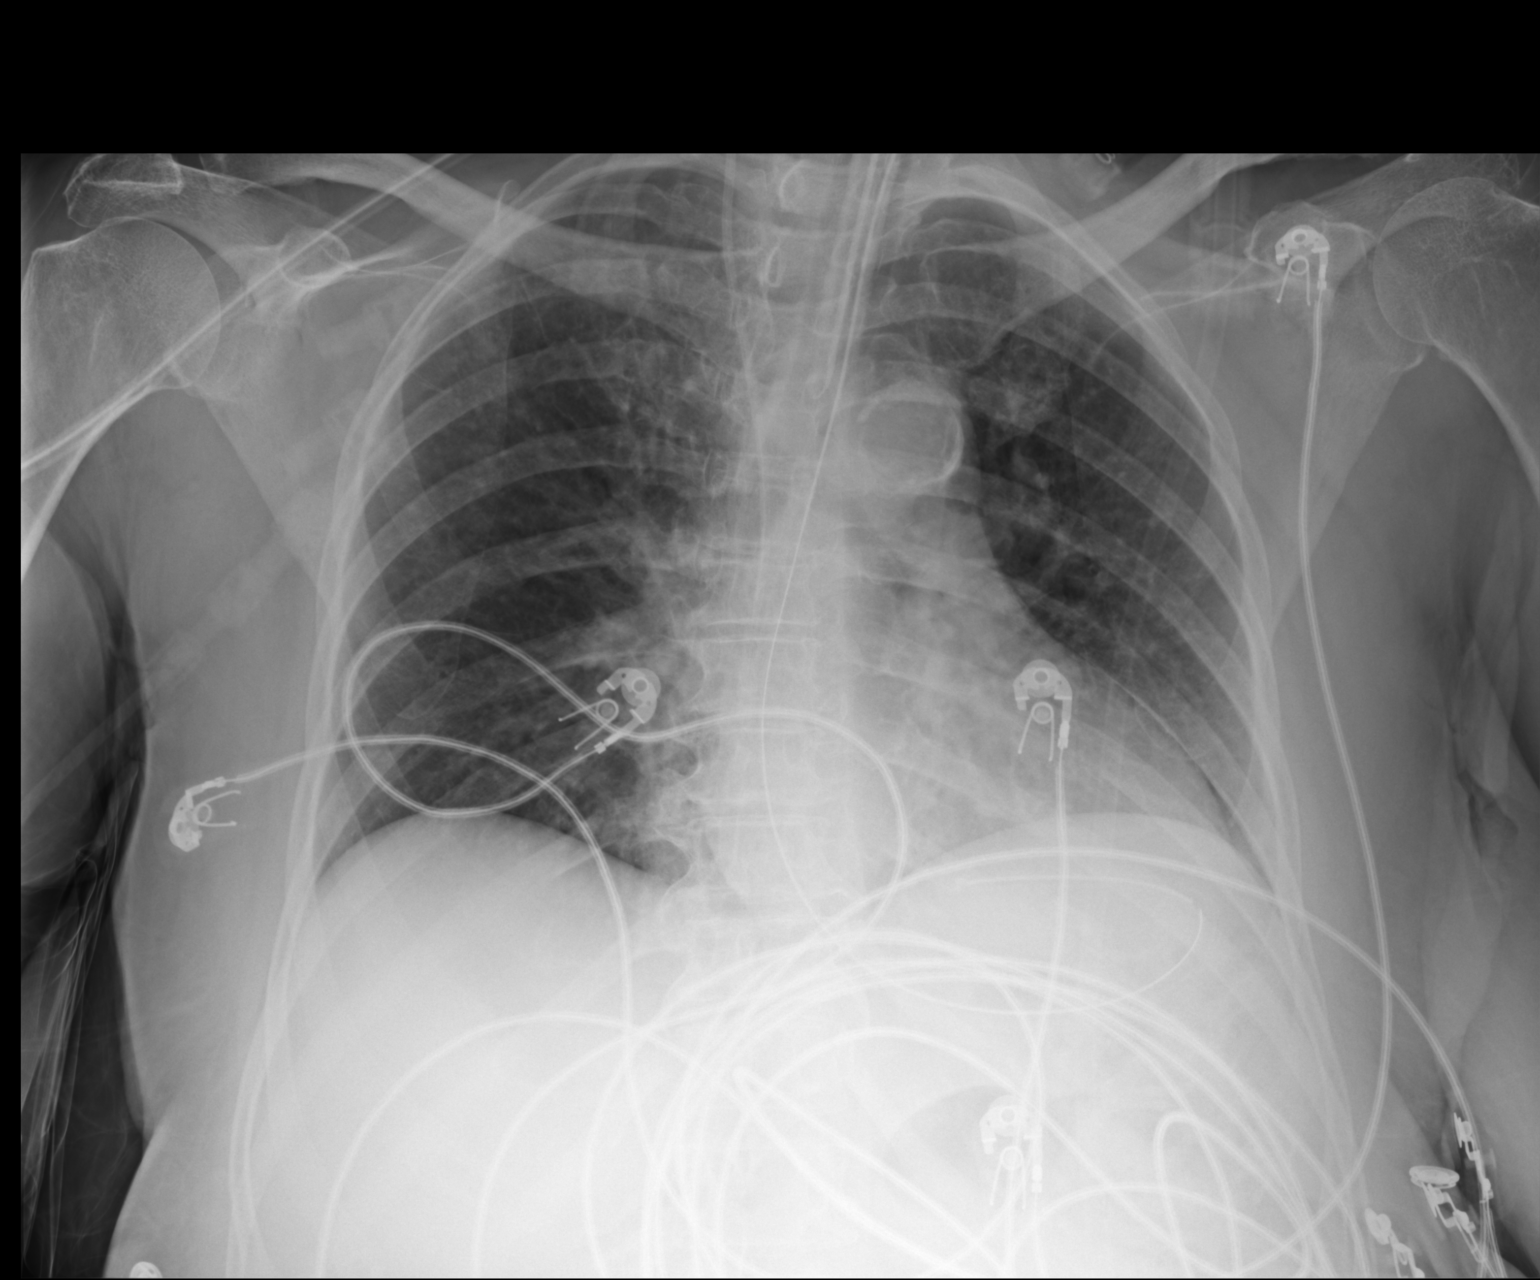

[1 of 1 positions shown; findings below may reference images not displayed]

FINDINGS: Endotracheal tube tip is 2.0 cm above the carina. Central catheter
tip is in the superior vena cava. Nasogastric tube tip and side port
are in the stomach. No pneumothorax. There is no edema or
consolidation. The heart size and pulmonary vascularity are normal.
No adenopathy. There is atherosclerotic calcification in the aortic
arch.
IMPRESSION: Tube and catheter positions as described without pneumothorax. No
edema or consolidation.

## 2016-10-13 IMAGING — CT CT HEAD W/O CM
2 series · 15 of 30 positions shown, 17 images · non-contrast
Comparison: Head CT 12/18/2013 and 01/26/2015.

CLINICAL DATA: 54-year-old with altered mental status. History of
hypertension, diabetes and schizophrenia.

EXAM:
CT HEAD WITHOUT CONTRAST
TECHNIQUE: Contiguous axial images were obtained from the base of the skull
through the vertex without intravenous contrast.

[Series 2: head without · axial · non-contrast · 0.43mm/px · z∈[-140,-25]mm · 7 of 31 slices shown, 9 images]
[im 4/31  brain]
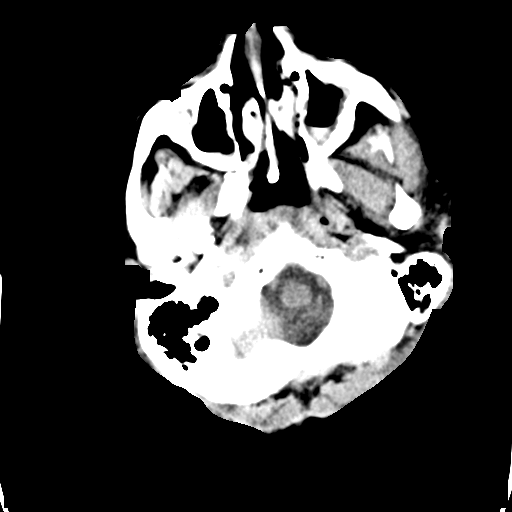
[im 4/31  bone]
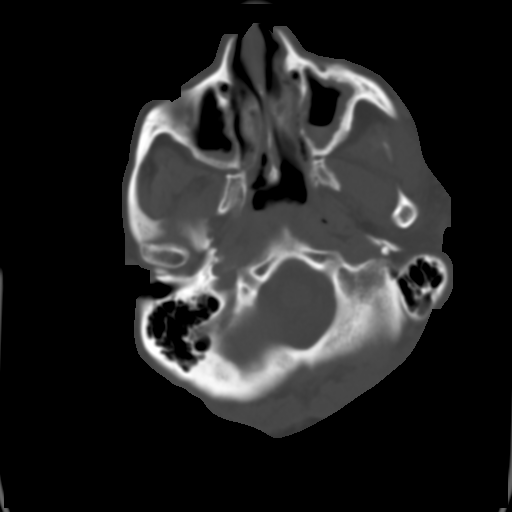
[im 8/31  brain]
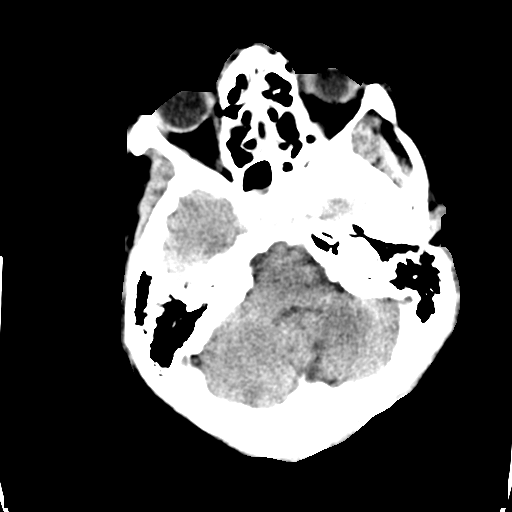
[im 12/31  brain]
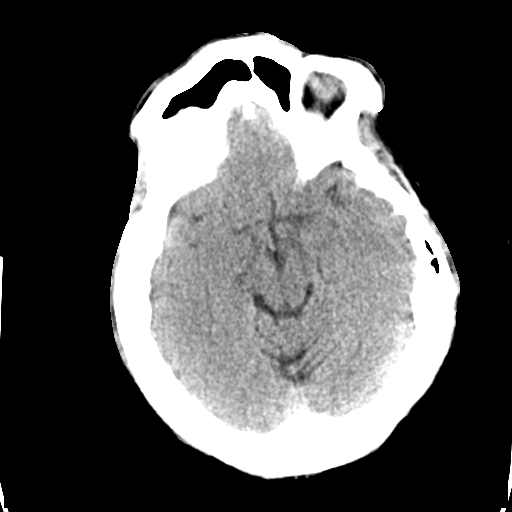
[im 16/31  brain]
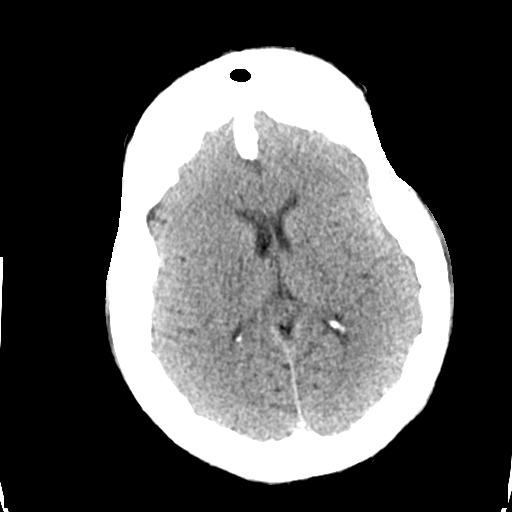
[im 19/31  brain]
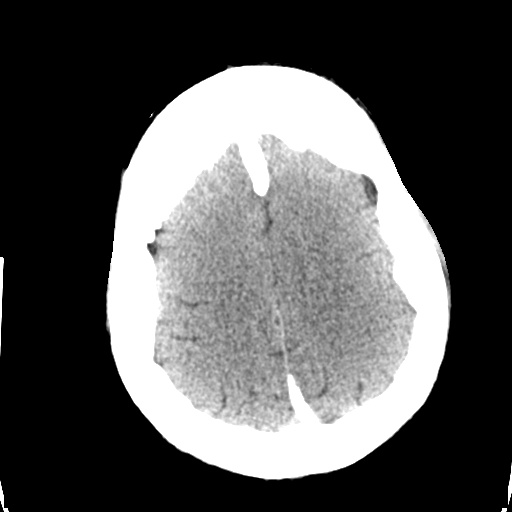
[im 19/31  bone]
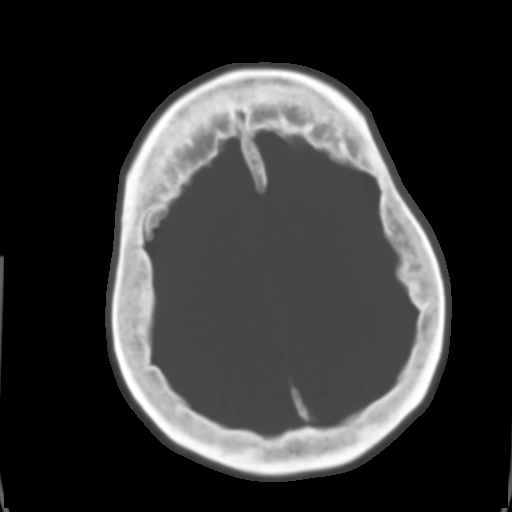
[im 23/31  brain]
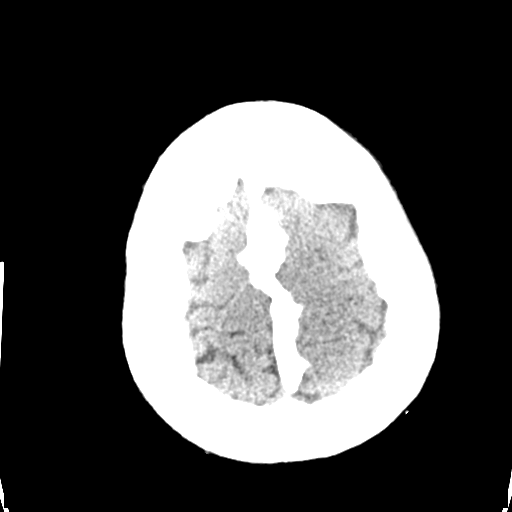
[im 27/31  brain]
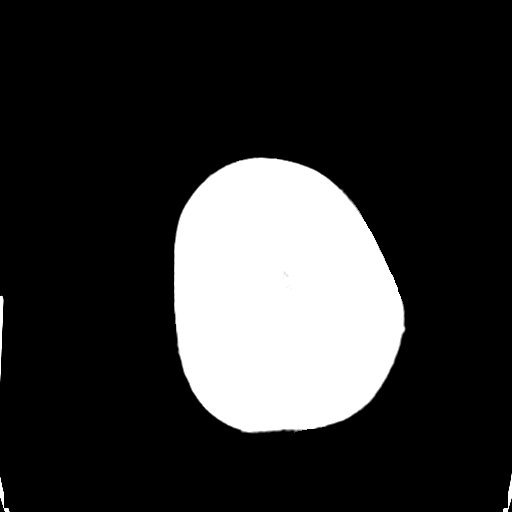

[Series 3: head bone · axial · 0.43mm/px · z∈[-141,-19]mm · 8 of 77 slices shown]
[im 8/77  bone]
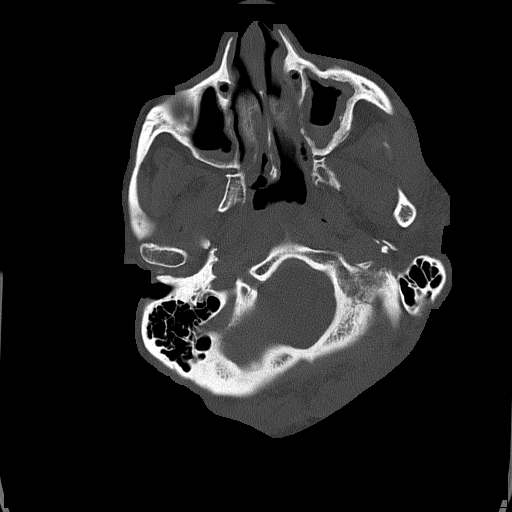
[im 16/77  bone]
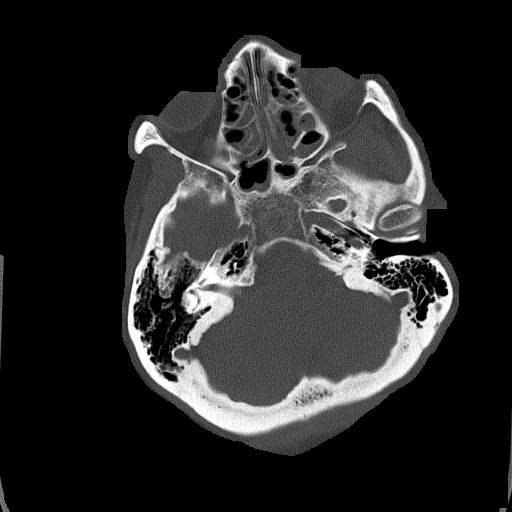
[im 23/77  bone]
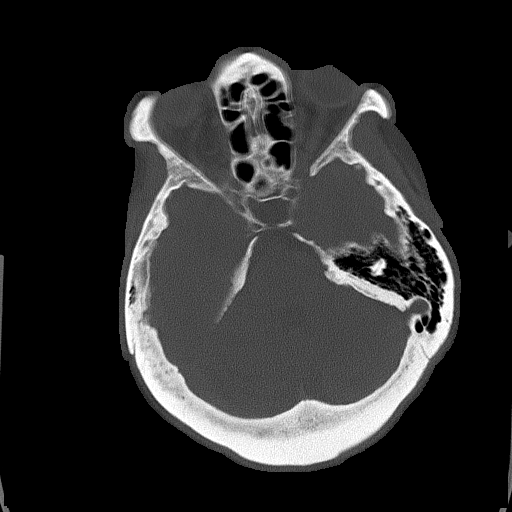
[im 35/77  bone]
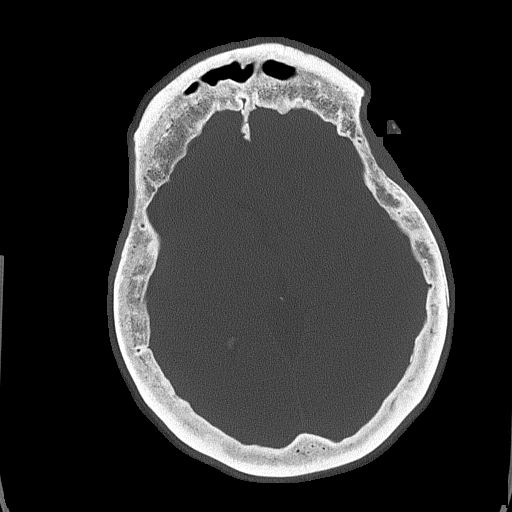
[im 42/77  bone]
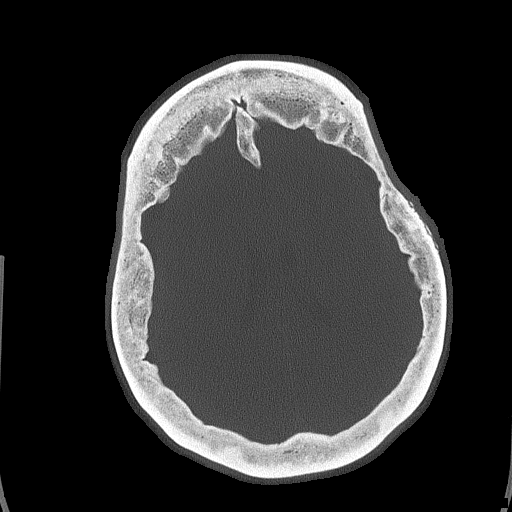
[im 54/77  bone]
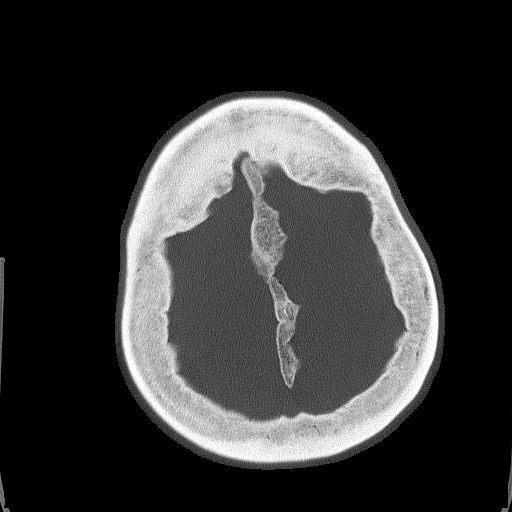
[im 61/77  bone]
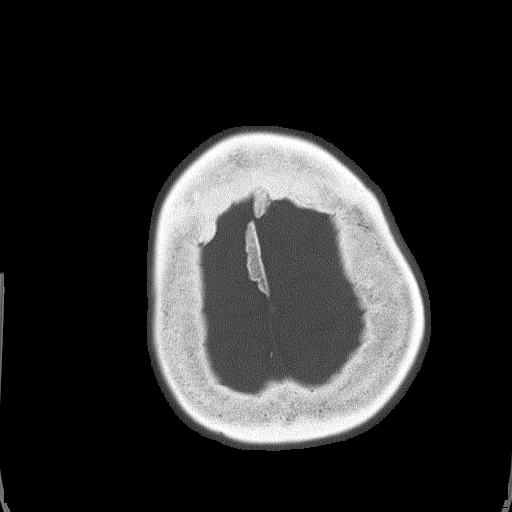
[im 69/77  bone]
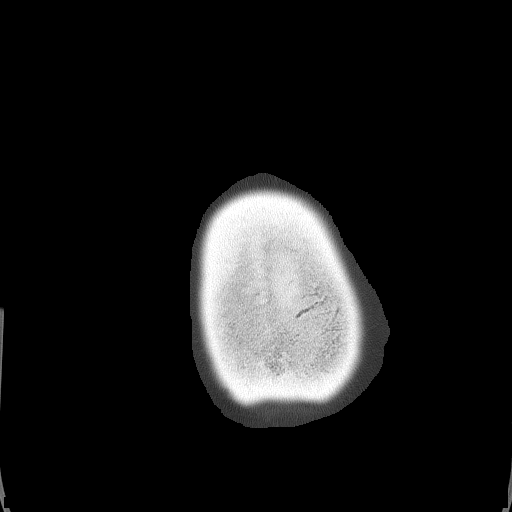

[15 of 30 positions shown; findings below may reference images not displayed]

FINDINGS: There is no evidence of acute intracranial hemorrhage, mass lesion,
brain edema or extra-axial fluid collection. The ventricles and
subarachnoid spaces are appropriately sized for age. There is no CT
evidence of acute cortical infarction.

Chronic mucosal thickening is again noted within the maxillary and
ethmoid sinuses bilaterally. There are no definite air-fluid levels.
The mastoid air cells and middle ears are clear. Extensive calvarial
hyperostosis is again seen. The calvarium is intact.
IMPRESSION: Stable head CT.  No acute intracranial findings.

## 2016-10-13 IMAGING — CR DG CHEST 1V PORT
1 series · 1 of 1 positions shown · non-contrast
Comparison: Chest x-rays dated 02/06/2015 in [REDACTED].

CLINICAL DATA: Hypotensive today and altered level of
consciousness. Recent urinary tract infection.

EXAM:
PORTABLE CHEST 1 VIEW

[AP]
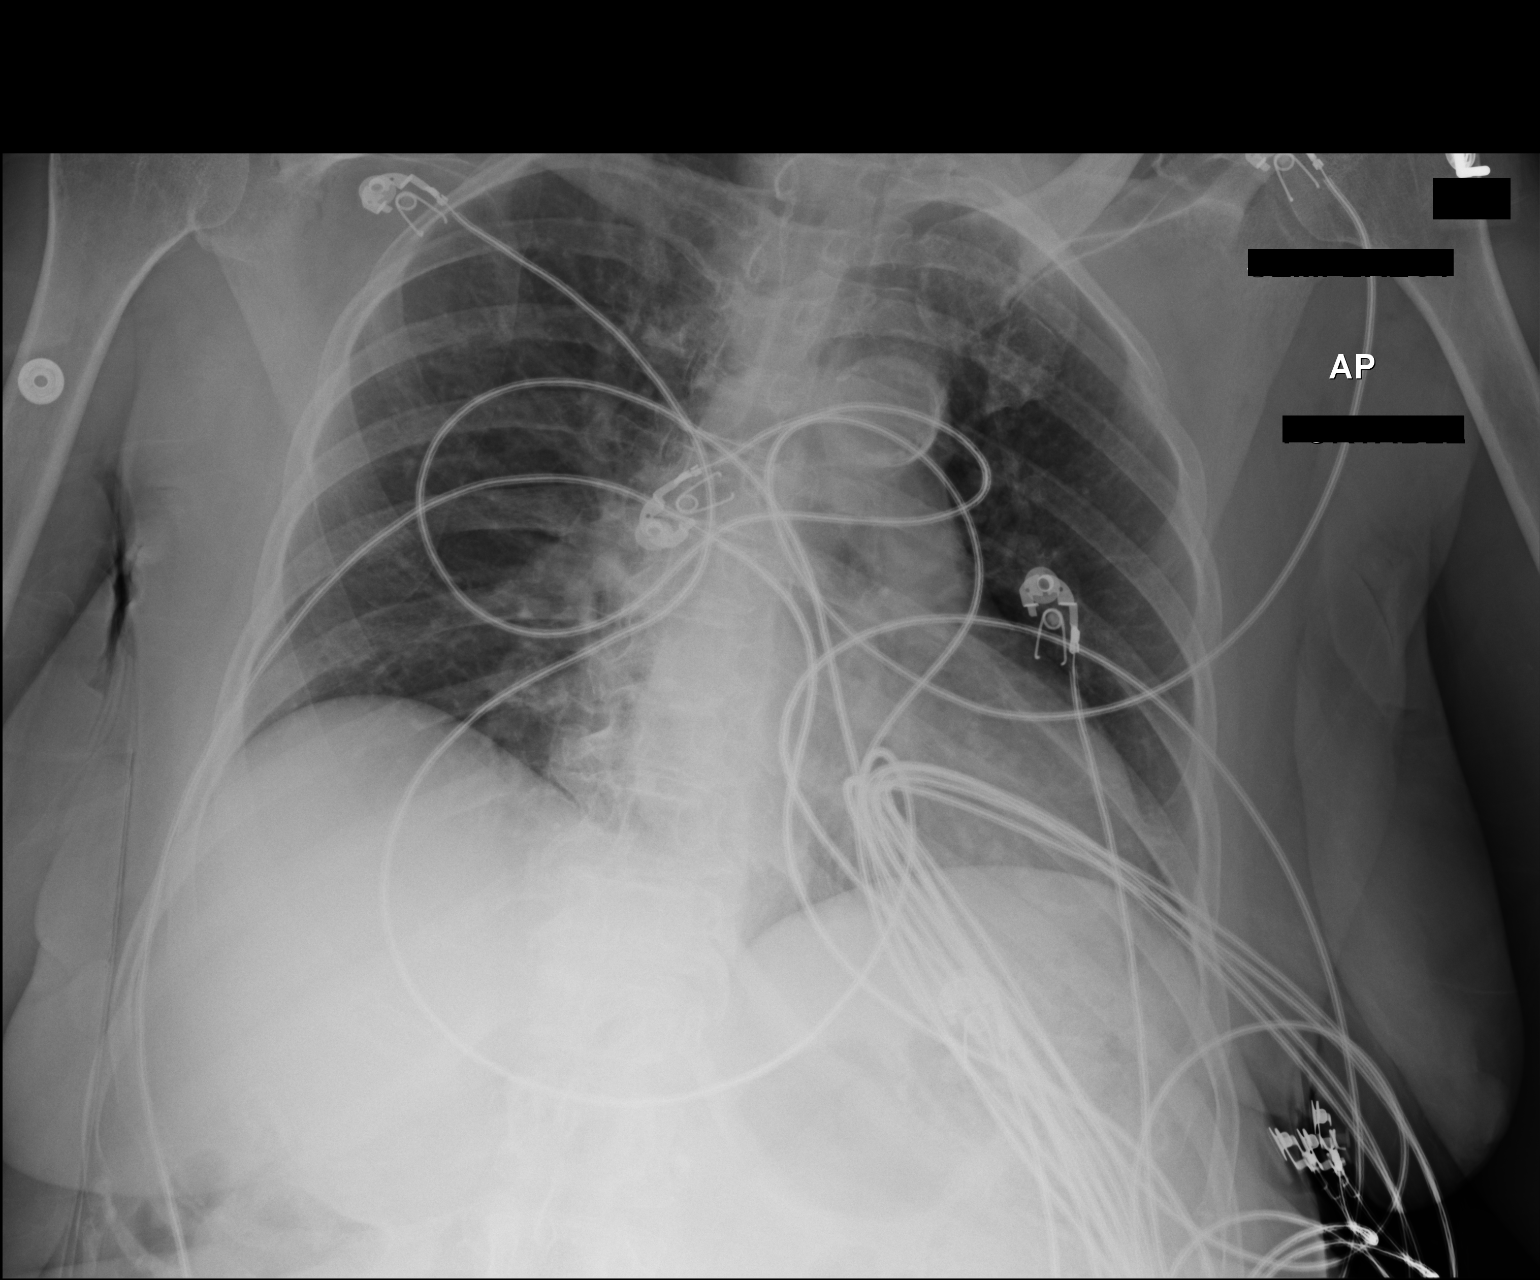

[1 of 1 positions shown; findings below may reference images not displayed]

FINDINGS: Study is somewhat hypoinspiratory with crowding of the perihilar
bronchovascular markings. Lungs are clear given the slightly low
lung volumes. No evidence of pneumonia. No pleural effusion seen.

Cardiomediastinal silhouette is stable in size and configuration.
Osseous and soft tissue structures about the chest are unremarkable.
IMPRESSION: Hypoinspiratory exam. No acute findings. No evidence of pneumonia or
pleural effusion.

## 2016-10-14 IMAGING — CR DG CHEST 1V PORT
1 series · 1 of 1 positions shown · non-contrast
Comparison: 02/24/2015.

CLINICAL DATA: Intubation.  Shortness breath.

EXAM:
PORTABLE CHEST 1 VIEW

[AP]
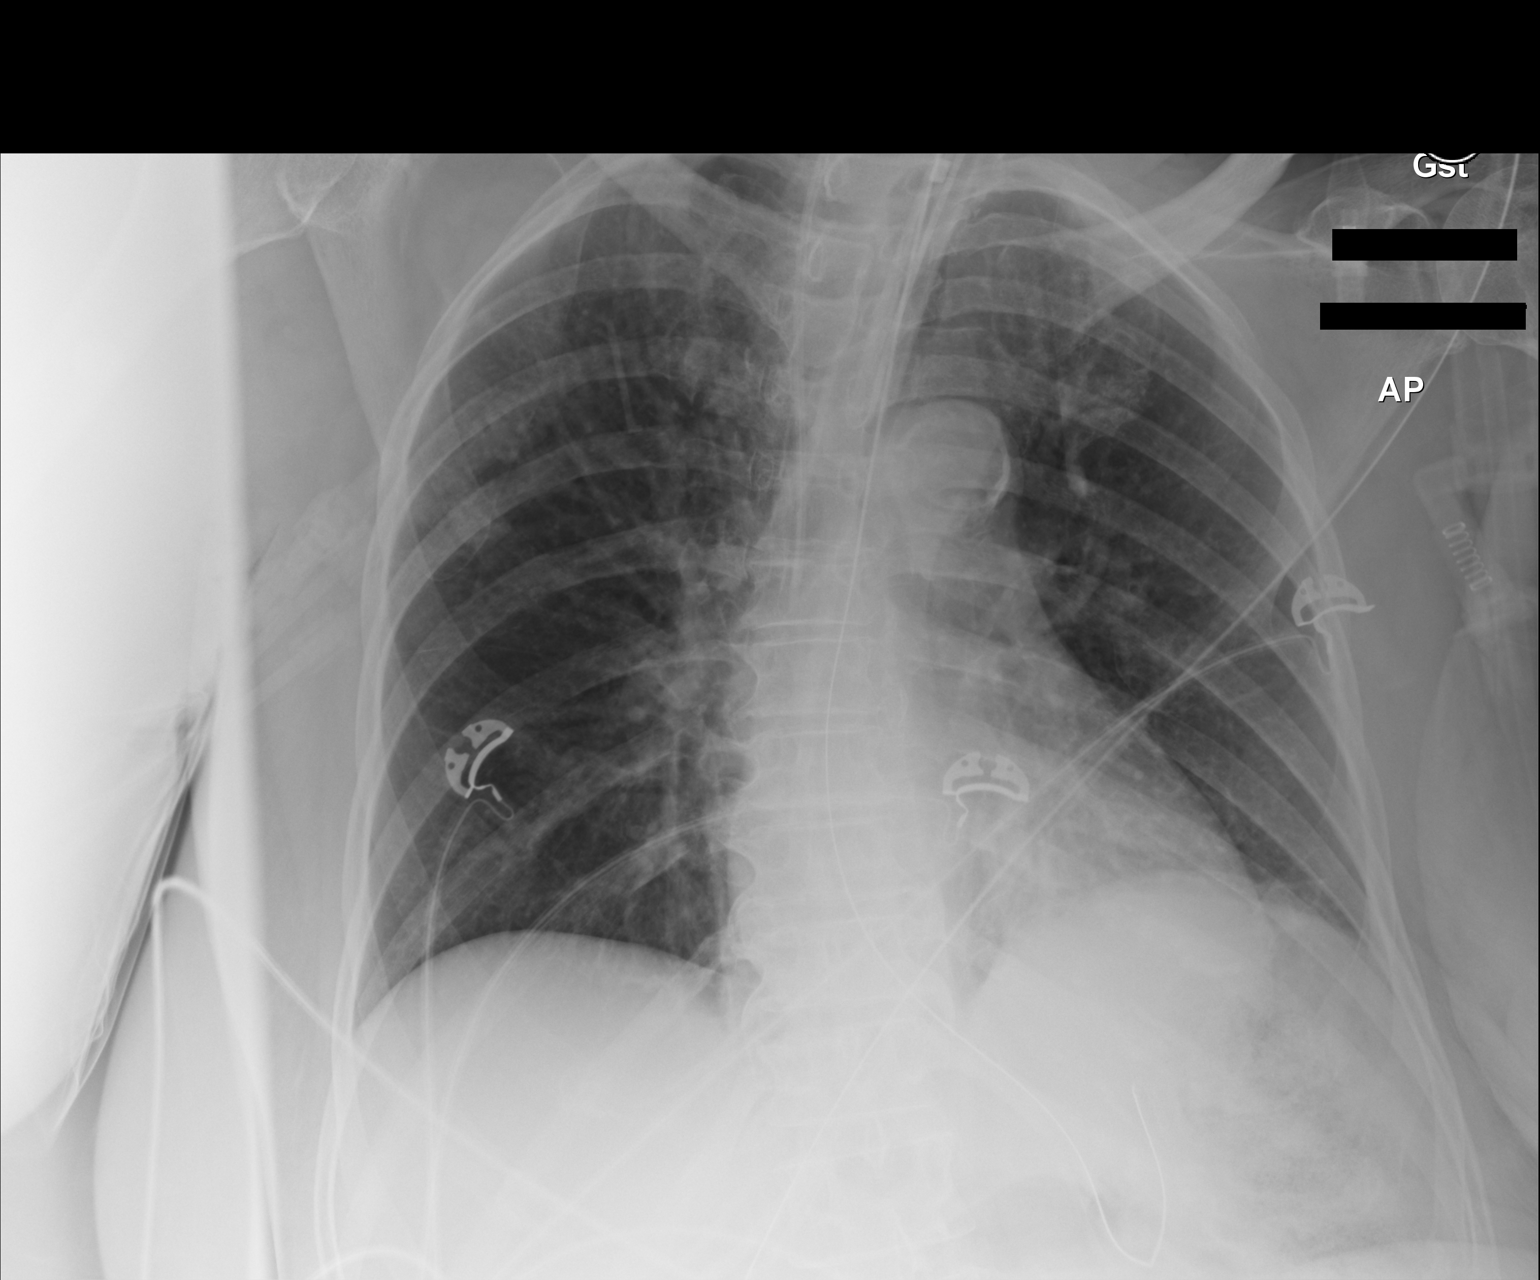

[1 of 1 positions shown; findings below may reference images not displayed]

FINDINGS: Endotracheal tube, NG tube, right IJ line in stable position. Mild
left lower subsegmental atelectasis and or infiltrate. No pleural
effusion or pneumothorax. Heart size stable.
IMPRESSION: 1. Lines and tubes in stable position.
2. Mild left base subsegmental atelectasis and or infiltrate.
